# Patient Record
Sex: Male | Born: 1995 | Race: Black or African American | Hispanic: No | Marital: Single | Smoking: Current every day smoker
Health system: Southern US, Community
[De-identification: ages and names within clinical notes are randomized; demographics above are authoritative.]

## PROBLEM LIST (undated history)

## (undated) DIAGNOSIS — R079 Chest pain, unspecified: Secondary | ICD-10-CM

## (undated) DIAGNOSIS — J45909 Unspecified asthma, uncomplicated: Secondary | ICD-10-CM

---

## 2020-05-03 ENCOUNTER — Ambulatory Visit (HOSPITAL_COMMUNITY)
Admission: EM | Admit: 2020-05-03 | Discharge: 2020-05-03 | Disposition: A | Discharge status: Home / Self Care | Payer: No Typology Code available for payment source | Attending: Family Medicine | Admitting: Family Medicine

## 2020-05-03 ENCOUNTER — Encounter (HOSPITAL_COMMUNITY): Payer: Self-pay | Admitting: *Deleted

## 2020-05-03 ENCOUNTER — Other Ambulatory Visit: Payer: Self-pay

## 2020-05-03 DIAGNOSIS — F419 Anxiety disorder, unspecified: Secondary | ICD-10-CM

## 2020-05-03 DIAGNOSIS — R079 Chest pain, unspecified: Secondary | ICD-10-CM

## 2020-05-03 HISTORY — DX: Unspecified asthma, uncomplicated: J45.909

## 2020-05-03 MED ORDER — HYDROXYZINE HCL 25 MG PO TABS: 25.0000 mg | ORAL_TABLET | Freq: Three times a day (TID) | ORAL | 0 refills | Status: DC | PRN

## 2020-05-03 MED ORDER — NAPROXEN 500 MG PO TABS: 500.0000 mg | ORAL_TABLET | Freq: Two times a day (BID) | ORAL | 0 refills | Status: DC | PRN

## 2020-05-03 NOTE — ED Provider Notes (Signed)
[facility]    CSN: [id number] Arrival date & time: 05/03/20  0807      History   Chief Complaint Chief Complaint  Patient presents with  . Chest Pain    HPI Howard Rodriguez is a 25 y.o. male.   Patient here today with sharp chest pain that started on the right side and now is also on the left side since waking up this morning.  He is not sure exactly when it started or having any ideas on what could have brought it on.  Nothing seems to make it better or worse, though he thinks eating may make it worse potentially.  He denies shortness of breath, dizziness, palpitations, abdominal pain, nausea vomiting diarrhea, fevers, history of similar incidences.  He does have a history of anxiety but states this does not feel similar.  He notes he has an interview later today and does feel very stressed.  No significant cardiac history in his immediate family that he is aware of and no chronic cardiopulmonary issues known personally.     Past Medical History:  Diagnosis Date  . Asthma     There are no problems to display for this patient.   History reviewed. No pertinent surgical history.     Home Medications    Prior to Admission medications   Medication Sig Start Date End Date Taking? Authorizing Provider  hydrOXYzine (ATARAX/VISTARIL) 25 MG tablet Take 1 tablet (25 mg total) by mouth every 8 (eight) hours as needed. 05/03/20  Yes Particia Nearing, PA-C  naproxen (NAPROSYN) 500 MG tablet Take 1 tablet (500 mg total) by mouth 2 (two) times daily as needed. 05/03/20  Yes Particia Nearing, PA-C    Family History History reviewed. No pertinent family history.  Social History Social History   Tobacco Use  . Smoking status: Current Every Day Smoker  . Smokeless tobacco: Never Used  Substance Use Topics  . Drug use: Yes    Types: Marijuana    Comment: pt uses once or twice a day     Allergies   Other   Review of Systems Review of Systems Per  HPI Physical Exam Triage Vital Signs ED Triage Vitals  Enc Vitals Group     BP 05/03/20 0844 114/64     Pulse Rate 05/03/20 0844 69     Resp 05/03/20 0844 18     Temp 05/03/20 0844 98 F (36.7 C)     Temp Source 05/03/20 0844 Oral     SpO2 05/03/20 0844 100 %     Weight --      Height --      Head Circumference --      Peak Flow --      Pain Score 05/03/20 0845 6     Pain Loc --      Pain Edu? --      Excl. in GC? --    No data found.  Updated Vital Signs BP 114/64 (BP Location: Left Arm)   Pulse 69   Temp 98 F (36.7 C) (Oral)   Resp 18   SpO2 100%   Visual Acuity Right Eye Distance:   Left Eye Distance:   Bilateral Distance:    Right Eye Near:   Left Eye Near:    Bilateral Near:     Physical Exam Vitals and nursing note reviewed.  Constitutional:      Appearance: Normal appearance.  HENT:     Head: Atraumatic.  Mouth/Throat:     Mouth: Mucous membranes are moist.     Pharynx: Oropharynx is clear.  Eyes:     Extraocular Movements: Extraocular movements intact.     Conjunctiva/sclera: Conjunctivae normal.  Cardiovascular:     Rate and Rhythm: Normal rate and regular rhythm.     Pulses: Normal pulses.     Heart sounds: Normal heart sounds.  Pulmonary:     Effort: Pulmonary effort is normal. No respiratory distress.     Breath sounds: Normal breath sounds. No wheezing or rales.  Abdominal:     General: Bowel sounds are normal. There is no distension.     Palpations: Abdomen is soft.     Tenderness: There is no abdominal tenderness. There is no guarding.  Musculoskeletal:        General: No tenderness. Normal range of motion.     Cervical back: Normal range of motion and neck supple.  Skin:    General: Skin is warm and dry.  Neurological:     General: No focal deficit present.     Mental Status: He is oriented to person, place, and time.     Sensory: No sensory deficit.  Psychiatric:        Mood and Affect: Mood normal.        Thought Content:  Thought content normal.        Judgment: Judgment normal.      UC Treatments / Results  Labs (all labs ordered are listed, but only abnormal results are displayed) Labs Reviewed - No data to display  EKG   Radiology No results found.  Procedures Procedures (including critical care time)  Medications Ordered in UC Medications - No data to display  Initial Impression / Assessment and Plan / UC Course  I have reviewed the triage vital signs and the nursing notes.  Pertinent labs & imaging results that were available during my care of the patient were reviewed by me and considered in my medical decision making (see chart for details).     Exam and vital signs very reassuring today and benign.  EKG normal sinus rhythm showing an incomplete bundle branch block, no old EKGs to compare this to.  Low suspicion for cardiac cause of his chest pain today given age, risk factors, quality of pain symptoms.  Do suspect these are more anxiety related and will trial hydroxyzine and naproxen as needed and give work note for today to see if this helps.  Discussed if not resolving or worsening to follow-up with cardiology and establish PCP in the area as his current PCP is in [place].  ED precautions given for acutely worsening symptoms.  Final Clinical Impressions(s) / UC Diagnoses   Final diagnoses:  Nonspecific chest pain  Anxiety   Discharge Instructions   None    ED Prescriptions    Medication Sig Dispense Auth. Provider   hydrOXYzine (ATARAX/VISTARIL) 25 MG tablet Take 1 tablet (25 mg total) by mouth every 8 (eight) hours as needed. 30 tablet Particia Nearing, [place]   naproxen (NAPROSYN) 500 MG tablet Take 1 tablet (500 mg total) by mouth 2 (two) times daily as needed. 30 tablet Particia Nearing, [place]     PDMP not reviewed this encounter.   Particia Nearing, [place] 05/03/20 1428

## 2020-05-03 NOTE — ED Triage Notes (Signed)
Pt reports CP started this morning . Pt un clear how long CP has been going today . Pt reports multiple Sx's with CP. No direct family Hx of heart disease . Pt reports CP worse when trying to sleep.

## 2020-08-13 ENCOUNTER — Other Ambulatory Visit: Payer: Self-pay

## 2020-08-13 ENCOUNTER — Encounter (HOSPITAL_COMMUNITY): Payer: Self-pay

## 2020-08-13 ENCOUNTER — Emergency Department (HOSPITAL_COMMUNITY)
Admission: EM | Admit: 2020-08-13 | Discharge: 2020-08-13 | Disposition: A | Discharge status: Home / Self Care | Payer: No Typology Code available for payment source | Attending: Emergency Medicine | Admitting: Emergency Medicine

## 2020-08-13 ENCOUNTER — Emergency Department (HOSPITAL_COMMUNITY): Payer: No Typology Code available for payment source

## 2020-08-13 DIAGNOSIS — R079 Chest pain, unspecified: Secondary | ICD-10-CM | POA: Insufficient documentation

## 2020-08-13 DIAGNOSIS — F1290 Cannabis use, unspecified, uncomplicated: Secondary | ICD-10-CM | POA: Insufficient documentation

## 2020-08-13 DIAGNOSIS — J45909 Unspecified asthma, uncomplicated: Secondary | ICD-10-CM | POA: Insufficient documentation

## 2020-08-13 DIAGNOSIS — Z87891 Personal history of nicotine dependence: Secondary | ICD-10-CM | POA: Diagnosis not present

## 2020-08-13 LAB — BASIC METABOLIC PANEL
Anion gap: 6 (ref 5–15)
BUN: 18 mg/dL (ref 6–20)
CO2: 25 mmol/L (ref 22–32)
Calcium: 9.4 mg/dL (ref 8.9–10.3)
Chloride: 106 mmol/L (ref 98–111)
Creatinine, Ser: 0.9 mg/dL (ref 0.61–1.24)
GFR, Estimated: >60 mL/min (ref >60)
Glucose, Bld: 92 mg/dL (ref 70–99)
Potassium: 4 mmol/L (ref 3.5–5.1)
Sodium: 137 mmol/L (ref 135–145)

## 2020-08-13 LAB — CBC
HCT: 45.7 % (ref 39.0–52.0)
Hemoglobin: 14.9 g/dL (ref 13.0–17.0)
MCH: 28.8 pg (ref 26.0–34.0)
MCHC: 32.6 g/dL (ref 30.0–36.0)
MCV: 88.2 fL (ref 80.0–100.0)
Platelets: 250 10*3/uL (ref 150–400)
RBC: 5.18 MIL/uL (ref 4.22–5.81)
RDW: 12.1 % (ref 11.5–15.5)
WBC: 6.2 10*3/uL (ref 4.0–10.5)
nRBC: 0 % (ref 0.0–0.2)

## 2020-08-13 LAB — TROPONIN I (HIGH SENSITIVITY)
Troponin I (High Sensitivity): 3 ng/L (ref <18)
Troponin I (High Sensitivity): 3 ng/L (ref <18)

## 2020-08-13 MED ORDER — ONDANSETRON 4 MG PO TBDP
4.0000 mg | ORAL_TABLET | Freq: Once | ORAL | Status: AC
  Administered 2020-08-13: 17:00:00 4 mg via ORAL
  Filled 2020-08-13: qty 1

## 2020-08-13 MED ORDER — IBUPROFEN 600 MG PO TABS: 600.0000 mg | ORAL_TABLET | Freq: Four times a day (QID) | ORAL | 0 refills | Status: DC | PRN

## 2020-08-13 NOTE — ED Provider Notes (Signed)
[facility] EMERGENCY DEPARTMENT Provider Note   CSN: [id number] Arrival date & time: 08/13/20  1631     History CC:  Chest pain   Howard Rodriguez is a 25 y.o. male presented emerged department left-sided chest pain.  He reports gradual onset while he was at work earlier today around 1:00.  He says he works at a [employer] and does a lot of lifting.  He began to feel a sharp, squeezing, pressure sensation in his epigastrium and left side of his breast.  It does not radiate anywhere.  It is similar feeling several months ago which lasted about a day and resolved on its own.  He says his current sensation is worse with certain movements, deep inspiration.  He has not taken any medication.  Nothing seems to make it better, although when he sits still does not bother him as much.  It is currently 5 out of 10 intensity.  He does report that he smokes marijuana but denies nicotine use.  He denies history of hypertension, diabetes, hyperlipidemia, or significant family history of MI.  He denies any other medical problems aside from occasional asthma.  HPI     Past Medical History:  Diagnosis Date   Asthma     There are no problems to display for this patient.   No past surgical history on file.     No family history on file.  Social History   Tobacco Use   Smoking status: Every Day    Pack years: 0.00   Smokeless tobacco: Never  Substance Use Topics   Drug use: Yes    Types: Marijuana    Comment: pt uses once or twice a day    Home Medications Prior to Admission medications   Medication Sig Start Date End Date Taking? Authorizing Provider  ibuprofen (ADVIL) 600 MG tablet Take 1 tablet (600 mg total) by mouth every 6 (six) hours as needed for up to 30 doses. 08/13/20  Yes Terald Sleeper, MD  hydrOXYzine (ATARAX/VISTARIL) 25 MG tablet Take 1 tablet (25 mg total) by mouth every 8 (eight) hours as needed. 05/03/20   Particia Nearing,  PA-C  naproxen (NAPROSYN) 500 MG tablet Take 1 tablet (500 mg total) by mouth 2 (two) times daily as needed. 05/03/20   Particia Nearing, PA-C    Allergies    Other  Review of Systems   Review of Systems  Constitutional:  Negative for chills and fever.  Eyes:  Negative for photophobia and visual disturbance.  Respiratory:  Negative for cough and shortness of breath.   Cardiovascular:  Positive for chest pain. Negative for palpitations.  Gastrointestinal:  Negative for abdominal pain and vomiting.  Musculoskeletal:  Positive for arthralgias and myalgias.  Skin:  Negative for color change and rash.  Neurological:  Negative for syncope and light-headedness.  All other systems reviewed and are negative.  Physical Exam Updated Vital Signs BP 136/74   Pulse (!) 50   Temp 97.6 F (36.4 C) (Oral)   Resp 10   SpO2 100%   Physical Exam Constitutional:      General: He is not in acute distress. HENT:     Head: Normocephalic and atraumatic.  Eyes:     Conjunctiva/sclera: Conjunctivae normal.     Pupils: Pupils are equal, round, and reactive to light.  Cardiovascular:     Rate and Rhythm: Normal rate and regular rhythm.     Pulses: Normal pulses.  Pulmonary:  Effort: Pulmonary effort is normal. No respiratory distress.  Abdominal:     General: There is no distension.     Tenderness: There is no abdominal tenderness.  Musculoskeletal:     Comments: Left intercostal chest ttp, worse with crowing rooster   Skin:    General: Skin is warm and dry.  Neurological:     General: No focal deficit present.     Mental Status: He is alert. Mental status is at baseline.  Psychiatric:        Mood and Affect: Mood normal.        Behavior: Behavior normal.    ED Results / Procedures / Treatments   Labs (all labs ordered are listed, but only abnormal results are displayed) Labs Reviewed  BASIC METABOLIC PANEL  CBC  TROPONIN I (HIGH SENSITIVITY)  TROPONIN I (HIGH SENSITIVITY)     EKG EKG Interpretation  Date/Time:  Saturday August 13 2020 16:50:15 EDT Ventricular Rate:  50 PR Interval:  160 QRS Duration: 94 QT Interval:  400 QTC Calculation: 364 R Axis:   86 Text Interpretation: Sinus bradycardia Incomplete right bundle branch block No significant change from May 03 2020 ecg no STEMI Confirmed by Alvester Chou [phone]) on 08/13/2020 7:13:41 PM  Radiology DG Chest 2 View  Result Date: 08/13/2020 CLINICAL DATA:  Chest pain and short of breath EXAM: CHEST - 2 VIEW COMPARISON:  None. FINDINGS: The heart size and mediastinal contours are within normal limits. Both lungs are clear. The visualized skeletal structures are unremarkable. IMPRESSION: No active cardiopulmonary disease. Electronically Signed   By: Marlan Palau M.D.   On: 08/13/2020 17:30    Procedures Procedures   Medications Ordered in ED Medications  ondansetron (ZOFRAN-ODT) disintegrating tablet 4 mg (4 mg Oral Given 08/13/20 1706)    ED Course  I have reviewed the triage vital signs and the nursing notes.  Pertinent labs & imaging results that were available during my care of the patient were reviewed by me and considered in my medical decision making (see chart for details).  This is a fairly healthy 25 year old male presenting to ED with left-sided chest pain.  I suspect this most likely related to costochondritis.  The pain is reproducible crowing rooster and with palpation of the intercostal muscle.  He has no risk factors for cardiac disease.  I have a lower suspicion for ACS, PE, pneumothorax, pneumonia, aortic dissection, or other life-threatening emergency.  I personally reviewed his labs, EKG, chest x-ray, which showed no acute emergencies.  I advised NSAIDs and conservative management at home.     Final Clinical Impression(s) / ED Diagnoses Final diagnoses:  Chest pain, unspecified type    Rx / DC Orders ED Discharge Orders          Ordered    ibuprofen (ADVIL) 600 MG tablet   Every 6 hours PRN        08/13/20 2008             Terald Sleeper, MD 08/14/20 1006

## 2020-08-13 NOTE — ED Triage Notes (Signed)
Patient complains of chest pain that is worse with cough and inspiration. Patient has been seen at urgent care in past for same. States that he does a lot of lifting for work. NAD

## 2020-08-13 NOTE — ED Provider Notes (Signed)
[facility] Provider Triage Evaluation Note  [place] , a 25 y.o. male  was evaluated in triage.  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  He states that the pain radiates into his left arm, and he has some associated chest tightness and shortness of breath.  Denies any palpitations or history of cardiac issues in the past.  Denies any recent travel or prolonged immobilization, denies any recent surgical procedures or family history of cardiac disease at young age..  Review of Systems  Positive: Chest pain, shortness of breath, chest tightness, nausea. Negative: Syncope, cough, wheezing, palpitations, diaphoresis, fever.  Physical Exam  BP 131/78 (BP Location: Left Arm)   Pulse (!) 50   Temp 97.8 F (36.6 C) (Oral)   Resp 16   SpO2 100%  Gen:   Awake, no distress   Resp:  Normal effort  MSK:   Moves extremities without difficulty  Other:  Regular rate and rhythm, no murmurs/gallops/rubs.  Lungs are CTA B  Medical Decision Making  Medically screening exam initiated at 5:00 PM.  Appropriate orders placed.  [place] was informed that the remainder of the evaluation will be completed by another provider, this initial triage assessment does not replace that evaluation, and the importance of remaining in the ED until their evaluation is complete.  This chart was dictated using voice recognition software, Dragon. Despite the best efforts of this provider to proofread and correct errors, errors may still occur which can change documentation meaning.    Sherrilee Gilles 08/13/20 1701    Koleen Distance, MD 08/14/20 1300

## 2021-02-19 ENCOUNTER — Other Ambulatory Visit: Payer: Self-pay

## 2021-02-19 ENCOUNTER — Emergency Department (HOSPITAL_COMMUNITY)
Admission: EM | Admit: 2021-02-19 | Discharge: 2021-02-20 | Disposition: A | Discharge status: Home / Self Care | Payer: Self-pay | Attending: Emergency Medicine | Admitting: Emergency Medicine

## 2021-02-19 DIAGNOSIS — F17200 Nicotine dependence, unspecified, uncomplicated: Secondary | ICD-10-CM | POA: Insufficient documentation

## 2021-02-19 DIAGNOSIS — S02401A Maxillary fracture, unspecified, initial encounter for closed fracture: Secondary | ICD-10-CM

## 2021-02-19 DIAGNOSIS — S0511XA Contusion of eyeball and orbital tissues, right eye, initial encounter: Secondary | ICD-10-CM

## 2021-02-19 DIAGNOSIS — S0240CA Maxillary fracture, right side, initial encounter for closed fracture: Secondary | ICD-10-CM | POA: Insufficient documentation

## 2021-02-19 DIAGNOSIS — W01198A Fall on same level from slipping, tripping and stumbling with subsequent striking against other object, initial encounter: Secondary | ICD-10-CM | POA: Diagnosis not present

## 2021-02-19 DIAGNOSIS — S01111A Laceration without foreign body of right eyelid and periocular area, initial encounter: Secondary | ICD-10-CM | POA: Insufficient documentation

## 2021-02-19 DIAGNOSIS — Z23 Encounter for immunization: Secondary | ICD-10-CM | POA: Insufficient documentation

## 2021-02-19 DIAGNOSIS — J45909 Unspecified asthma, uncomplicated: Secondary | ICD-10-CM | POA: Diagnosis not present

## 2021-02-19 DIAGNOSIS — S0990XA Unspecified injury of head, initial encounter: Secondary | ICD-10-CM | POA: Diagnosis present

## 2021-02-19 DIAGNOSIS — S0181XA Laceration without foreign body of other part of head, initial encounter: Secondary | ICD-10-CM
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
## 2021-02-20 ENCOUNTER — Emergency Department (HOSPITAL_COMMUNITY): Payer: Self-pay

## 2021-02-20 MED ORDER — CEPHALEXIN 500 MG PO CAPS: 500.0000 mg | ORAL_CAPSULE | Freq: Four times a day (QID) | ORAL | 0 refills | Status: DC

## 2021-02-20 MED ORDER — LIDOCAINE-EPINEPHRINE-TETRACAINE (LET) TOPICAL GEL
3.0000 mL | Freq: Once | TOPICAL | Status: AC
  Administered 2021-02-20: 03:00:00 3 mL via TOPICAL
  Filled 2021-02-20: qty 3

## 2021-02-20 MED ORDER — TETANUS-DIPHTH-ACELL PERTUSSIS 5-2.5-18.5 LF-MCG/0.5 IM SUSY
0.5000 mL | PREFILLED_SYRINGE | Freq: Once | INTRAMUSCULAR | Status: AC
  Administered 2021-02-20: 03:00:00 0.5 mL via INTRAMUSCULAR
  Filled 2021-02-20: qty 0.5

## 2021-02-20 MED ORDER — KETOROLAC TROMETHAMINE 15 MG/ML IJ SOLN
15.0000 mg | Freq: Once | INTRAMUSCULAR | Status: AC
  Administered 2021-02-20: 06:00:00 15 mg via INTRAMUSCULAR
  Filled 2021-02-20: qty 1

## 2021-02-20 MED ORDER — NAPROXEN 500 MG PO TABS: 500.0000 mg | ORAL_TABLET | Freq: Two times a day (BID) | ORAL | 0 refills | Status: DC

## 2021-02-20 MED ORDER — FLUORESCEIN SODIUM 1 MG OP STRP
ORAL_STRIP | OPHTHALMIC | Status: AC
  Filled 2021-02-20: qty 1

## 2021-02-20 MED ORDER — LIDOCAINE-EPINEPHRINE (PF) 2 %-1:200000 IJ SOLN
10.0000 mL | Freq: Once | INTRAMUSCULAR | Status: AC
  Administered 2021-02-20: 10 mL
  Filled 2021-02-20: qty 20

## 2021-02-20 MED ORDER — HYDROCODONE-ACETAMINOPHEN 5-325 MG PO TABS
2.0000 | ORAL_TABLET | Freq: Once | ORAL | Status: AC
  Administered 2021-02-20: 03:00:00 2 via ORAL
  Filled 2021-02-20: qty 2
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
## 2021-02-20 NOTE — ED Provider Notes (Signed)
..  Laceration Repair  Date/Time: 02/20/2021 5:00 AM Performed by: Glynn Octave, MD Authorized by: Glynn Octave, MD   Consent:    Consent obtained:  Verbal   Consent given by:  Patient   Risks, benefits, and alternatives were discussed: yes     Risks discussed:  Infection, need for additional repair, poor cosmetic result, pain and poor wound healing   Alternatives discussed:  No treatment Universal protocol:    Procedure explained and questions answered to patient or proxy's satisfaction: yes     Imaging studies available: yes     Patient identity confirmed:  Verbally with patient Anesthesia:    Anesthesia method:  Local infiltration   Local anesthetic:  Lidocaine 2% WITH epi Laceration details:    Location:  Face   Face location:  R upper eyelid   Extent:  Partial thickness   Length (cm):  4 Pre-procedure details:    Preparation:  Patient was prepped and draped in usual sterile fashion and imaging obtained to evaluate for foreign bodies Exploration:    Limited defect created (wound extended): no     Hemostasis achieved with:  Direct pressure and epinephrine   Imaging outcome: foreign body not noted     Wound exploration: wound explored through full range of motion and entire depth of wound visualized     Wound extent: areolar tissue violated and underlying fracture     Contaminated: no   Treatment:    Area cleansed with:  Povidone-iodine   Amount of cleaning:  Standard   Irrigation solution:  Sterile saline   Irrigation method:  Pressure wash   Debridement:  Minimal Skin repair:    Repair method:  Sutures   Suture size:  6-0   Suture material:  Nylon   Suture technique:  Simple interrupted   Number of sutures:  6 Approximation:    Approximation:  Close Repair type:    Repair type:  Intermediate Post-procedure details:    Dressing:  Antibiotic ointment and adhesive bandage   Procedure completion:  Suzi Roots, MD 02/20/21 4503

## 2021-02-20 NOTE — ED Notes (Signed)
Patient transported to CT 

## 2021-02-20 NOTE — ED Notes (Signed)
Pt  able to ambulate with strong and steady gate and passes PO challenge. Provider notified of same.

## 2021-02-20 NOTE — Discharge Instructions (Addendum)
Take Keflex as prescribed. You may use Naproxen for management of pain. Avoid soaking your wound in stagnant or dirty water such as while taking a bath. You can shower normally. Keep the area clean with mild soap and warm water. Do not apply peroxide or alcohol to your wound as this can break down newly forming skin and prolong wound healing. If you keep the area bandaged, change the dressing/bandage at least once per day. Have your sutures removed in 7 days.

## 2021-03-11 ENCOUNTER — Other Ambulatory Visit: Payer: Self-pay

## 2021-03-11 ENCOUNTER — Emergency Department (HOSPITAL_COMMUNITY)
Admission: EM | Admit: 2021-03-11 | Discharge: 2021-03-12 | Disposition: A | Discharge status: Home / Self Care | Payer: 59 | Attending: Emergency Medicine | Admitting: Emergency Medicine

## 2021-03-11 ENCOUNTER — Encounter (HOSPITAL_COMMUNITY): Payer: Self-pay | Admitting: *Deleted

## 2021-03-11 ENCOUNTER — Emergency Department (HOSPITAL_COMMUNITY): Payer: 59

## 2021-03-11 DIAGNOSIS — R0789 Other chest pain: Secondary | ICD-10-CM | POA: Diagnosis not present

## 2021-03-11 DIAGNOSIS — Z5321 Procedure and treatment not carried out due to patient leaving prior to being seen by health care provider: Secondary | ICD-10-CM | POA: Diagnosis not present

## 2021-03-11 DIAGNOSIS — F419 Anxiety disorder, unspecified: Secondary | ICD-10-CM | POA: Diagnosis not present

## 2021-03-11 DIAGNOSIS — R0602 Shortness of breath: Secondary | ICD-10-CM | POA: Diagnosis not present

## 2021-03-11 LAB — SALICYLATE LEVEL: Salicylate Lvl: <7 mg/dL — ABNORMAL LOW (ref 7.0–30.0)

## 2021-03-11 LAB — CBC
HCT: 44.3 % (ref 39.0–52.0)
Hemoglobin: 14.4 g/dL (ref 13.0–17.0)
MCH: 28.5 pg (ref 26.0–34.0)
MCHC: 32.5 g/dL (ref 30.0–36.0)
MCV: 87.5 fL (ref 80.0–100.0)
Platelets: 280 10*3/uL (ref 150–400)
RBC: 5.06 MIL/uL (ref 4.22–5.81)
RDW: 12.1 % (ref 11.5–15.5)
WBC: 9.7 10*3/uL (ref 4.0–10.5)
nRBC: 0 % (ref 0.0–0.2)

## 2021-03-11 LAB — RAPID URINE DRUG SCREEN, HOSP PERFORMED
Amphetamines: NOT DETECTED
Barbiturates: NOT DETECTED
Benzodiazepines: NOT DETECTED
Cocaine: NOT DETECTED
Opiates: NOT DETECTED
Tetrahydrocannabinol: POSITIVE — AB

## 2021-03-11 LAB — BASIC METABOLIC PANEL
Anion gap: 11 (ref 5–15)
BUN: 20 mg/dL (ref 6–20)
CO2: 23 mmol/L (ref 22–32)
Calcium: 9.3 mg/dL (ref 8.9–10.3)
Chloride: 101 mmol/L (ref 98–111)
Creatinine, Ser: 0.97 mg/dL (ref 0.61–1.24)
GFR, Estimated: >60 mL/min (ref >60)
Glucose, Bld: 103 mg/dL — ABNORMAL HIGH (ref 70–99)
Potassium: 3.8 mmol/L (ref 3.5–5.1)
Sodium: 135 mmol/L (ref 135–145)

## 2021-03-11 LAB — ACETAMINOPHEN LEVEL: Acetaminophen (Tylenol), Serum: <10 ug/mL — ABNORMAL LOW (ref 10–30)

## 2021-03-11 LAB — ETHANOL: Alcohol, Ethyl (B): <10 mg/dL (ref <10)

## 2021-03-11 LAB — TROPONIN I (HIGH SENSITIVITY): Troponin I (High Sensitivity): 3 ng/L (ref <18)

## 2021-03-11 NOTE — ED Triage Notes (Signed)
The pt ate some mushrooms approx 2 hours ago now c/o chest pain  he has ingested mushrooms previously

## 2021-03-11 NOTE — ED Provider Triage Note (Signed)
Emergency Medicine Provider Triage Evaluation Note  [place] , a 26 y.o. male  was evaluated in triage.  Pt complains of chest tightness, SOB, like heart is going to stop.  States that symptoms started after using mushrooms tonight.  Has never experienced this before.  Denies any other coingestants.   Review of Systems  Positive: Chest tightness, anxiety, sob Negative: Fever, cough  Physical Exam  BP 133/74    Pulse 78    Temp 98.2 F (36.8 C) (Oral)    Resp 16    Ht 6\' 1"  (1.854 m)    Wt 65.8 kg    SpO2 98%    BMI 19.13 kg/m  Gen:   Awake, no distress   Resp:  Normal effort  MSK:   Moves extremities without difficulty  Other:    Medical Decision Making  Medically screening exam initiated at 10:18 PM.  Appropriate orders placed.  Jantz was informed that the remainder of the evaluation will be completed by another provider, this initial triage assessment does not replace that evaluation, and the importance of remaining in the ED until their evaluation is complete.  Chest tightness, anxiety, drug use   [place], Roxy Horseman 03/11/21 2219

## 2021-03-11 NOTE — ED Notes (Signed)
Left without being seen.

## 2021-03-11 NOTE — ED Notes (Signed)
Girlfriend flagged RN down due to concern for pt. Pt now states he "ate some shrooms". RN provided verbal reassurance.

## 2021-03-11 NOTE — ED Notes (Signed)
Pt reports that he imbibed in smoking marijuana and that is when he starting feeling "like he is going to die".

## 2021-03-11 NOTE — ED Notes (Signed)
Pt eating [employer] chips with large grin on face and walking towards door with GF. Rn asked if he was leaving. He states "I think I'm fine; I'm leaving". Ambulated with steady gait.

## 2021-10-28 ENCOUNTER — Emergency Department (HOSPITAL_COMMUNITY)
Admission: EM | Admit: 2021-10-28 | Discharge: 2021-10-28 | Disposition: A | Discharge status: Home / Self Care | Payer: 59 | Attending: Emergency Medicine | Admitting: Emergency Medicine

## 2021-10-28 ENCOUNTER — Other Ambulatory Visit: Payer: Self-pay

## 2021-10-28 ENCOUNTER — Encounter (HOSPITAL_COMMUNITY): Payer: Self-pay | Admitting: Emergency Medicine

## 2021-10-28 DIAGNOSIS — R110 Nausea: Secondary | ICD-10-CM | POA: Diagnosis not present

## 2021-10-28 DIAGNOSIS — T7840XA Allergy, unspecified, initial encounter: Secondary | ICD-10-CM | POA: Diagnosis present

## 2021-10-28 DIAGNOSIS — T7802XA Anaphylactic reaction due to shellfish (crustaceans), initial encounter: Secondary | ICD-10-CM | POA: Diagnosis not present

## 2021-10-28 DIAGNOSIS — R0602 Shortness of breath: Secondary | ICD-10-CM | POA: Insufficient documentation

## 2021-10-28 MED ORDER — PREDNISONE 20 MG PO TABS
60.0000 mg | ORAL_TABLET | Freq: Once | ORAL | Status: AC
  Administered 2021-10-28: 60 mg via ORAL
  Filled 2021-10-28: qty 3

## 2021-10-28 MED ORDER — DIPHENHYDRAMINE HCL 25 MG PO CAPS
25.0000 mg | ORAL_CAPSULE | Freq: Once | ORAL | Status: AC
Start: 2021-10-28 — End: 2021-10-28
  Administered 2021-10-28: 25 mg via ORAL
  Filled 2021-10-28: qty 1

## 2021-10-28 MED ORDER — ONDANSETRON 4 MG PO TBDP
4.0000 mg | ORAL_TABLET | Freq: Once | ORAL | Status: AC
  Administered 2021-10-28: 4 mg via ORAL
  Filled 2021-10-28: qty 1

## 2021-10-28 MED ORDER — EPINEPHRINE 0.3 MG/0.3ML IJ SOAJ: 0.3000 mg | INTRAMUSCULAR | 1 refills | Status: DC | PRN

## 2021-10-28 MED ORDER — PREDNISONE 20 MG PO TABS: ORAL_TABLET | ORAL | 0 refills | Status: DC

## 2021-10-28 NOTE — Discharge Instructions (Signed)
Take the prescribed medication as directed.  Can continue benadryl as needed as well. Keep epi pen with you at all times.  Use for severe lip/tongue swelling, throat closing, shortness of breath, etc.  If used, you do need to come to the ED for monitoring. Follow-up with your primary care doctor. Return to the ED for new or worsening symptoms.

## 2021-10-28 NOTE — ED Triage Notes (Signed)
Patient reports feeling dizzy/lightheaded after eating calamari this morning with mild chest tightness , respirations unlabored , airway intact / no oral swelling .

## 2021-10-28 NOTE — ED Provider Notes (Signed)
[facility] EMERGENCY DEPARTMENT Provider Note   CSN: [id number] Arrival date & time: 10/28/21  0315     History  Chief Complaint  Patient presents with   Allergic Reaction    Howard Rodriguez is a 26 y.o. male.  The history is provided by the patient and medical records.  Allergic Reaction  26 year old male presenting today with concern of allergic reaction.  He has known shellfish allergy and had some calamari tonight from restaurant and shortly afterwards started to have some trouble breathing and nausea, felt as if he may pass out.  No LOC.  Denies vomiting.  No lip/tongue swelling.  No trouble swallowing.  No intervention tried PTA.  Does not have epi pen at home.  Home Medications Prior to Admission medications   Medication Sig Start Date End Date Taking? Authorizing Provider  EPINEPHrine 0.3 mg/0.3 mL IJ SOAJ injection Inject 0.3 mg into the muscle as needed for anaphylaxis. 10/28/21  Yes Garlon Hatchet, PA-C  predniSONE (DELTASONE) 20 MG tablet Take 40 mg by mouth daily for 3 days, then 20mg  by mouth daily for 3 days, then 10mg  daily for 3 days 10/28/21  Yes , PA-C  acetaminophen (NON-ASPIRIN) 500 MG tablet Take 2,000 mg by mouth every 6 (six) hours as needed for mild pain.    [provider]  cephALEXin (KEFLEX) 500 MG capsule Take 1 capsule (500 mg total) by mouth 4 (four) times daily. 02/20/21   Garlon Hatchet, PA-C  hydrOXYzine (ATARAX/VISTARIL) 25 MG tablet Take 1 tablet (25 mg total) by mouth every 8 (eight) hours as needed. Patient not taking: Reported on 02/20/2021 05/03/20   02/22/2021, PA-C  ibuprofen (ADVIL) 600 MG tablet Take 1 tablet (600 mg total) by mouth every 6 (six) hours as needed for up to 30 doses. Patient not taking: Reported on 02/20/2021 08/13/20   02/22/2021, MD  naproxen (NAPROSYN) 500 MG tablet Take 1 tablet (500 mg total) by mouth 2 (two) times daily. 02/20/21   Terald Sleeper, PA-C       Allergies    Other    Review of Systems   Review of Systems  Constitutional:        Allergic rxn  All other systems reviewed and are negative.   Physical Exam Updated Vital Signs BP 134/83   Pulse 64   Temp 98.1 F (36.7 C) (Oral)   Resp 16   SpO2 99%   Physical Exam Vitals and nursing note reviewed.  Constitutional:      Appearance: He is well-developed.  HENT:     Head: Normocephalic and atraumatic.     Mouth/Throat:     Comments: No lip/tongue swelling, handling secretions well, no stridor Eyes:     Conjunctiva/sclera: Conjunctivae normal.     Pupils: Pupils are equal, round, and reactive to light.  Cardiovascular:     Rate and Rhythm: Normal rate and regular rhythm.     Heart sounds: Normal heart sounds.  Pulmonary:     Effort: Pulmonary effort is normal. No respiratory distress.     Breath sounds: Normal breath sounds. No rhonchi.     Comments: Lungs CTAB Abdominal:     General: Bowel sounds are normal.     Palpations: Abdomen is soft.     Tenderness: There is no abdominal tenderness. There is no rebound.  Musculoskeletal:        General: Normal range of motion.     Cervical back: Normal range of  motion.  Skin:    General: Skin is warm and dry.  Neurological:     Mental Status: He is alert and oriented to person, place, and time.     ED Results / Procedures / Treatments   Labs (all labs ordered are listed, but only abnormal results are displayed) Labs Reviewed - No data to display  EKG None  Radiology No results found.  Procedures Procedures    Medications Ordered in ED Medications  predniSONE (DELTASONE) tablet 60 mg (60 mg Oral Given 10/28/21 0411)  diphenhydrAMINE (BENADRYL) capsule 25 mg (25 mg Oral Given 10/28/21 0412)  ondansetron (ZOFRAN-ODT) disintegrating tablet 4 mg (4 mg Oral Given 10/28/21 0411)    ED Course/ Medical Decision Making/ A&P                           Medical Decision Making Risk Prescription drug  management.   26 y.o. M here with allergic reaction after eating calamari.  He does have known shellfish allergy.  Afebrile, non-toxic.  Does not have any lip or tongue swelling, handling secretions well, no stridor.  Lungs are clear without any wheezes or rhonchi.  Possibly reaction due to cross-contamination.  He was treated here with prednisone and Benadryl, tolerated well.  Stable for discharge home with same.  Given prescription for EpiPen, instructions and indications for admission discussed.  Follow-up with PCP.  Return here for any concerns.  Final Clinical Impression(s) / ED Diagnoses Final diagnoses:  Allergic reaction, initial encounter    Rx / DC Orders ED Discharge Orders          Ordered    predniSONE (DELTASONE) 20 MG tablet        10/28/21 0438    EPINEPHrine 0.3 mg/0.3 mL IJ SOAJ injection  As needed        10/28/21 0438              Garlon Hatchet, PA-C 10/28/21 0454    Melene Plan, DO 10/28/21 [phone]

## 2021-11-30 ENCOUNTER — Emergency Department (HOSPITAL_COMMUNITY): Payer: PRIVATE HEALTH INSURANCE

## 2021-11-30 ENCOUNTER — Other Ambulatory Visit: Payer: Self-pay

## 2021-11-30 ENCOUNTER — Encounter (HOSPITAL_COMMUNITY): Payer: Self-pay | Admitting: Emergency Medicine

## 2021-11-30 ENCOUNTER — Emergency Department (HOSPITAL_COMMUNITY)
Admission: EM | Admit: 2021-11-30 | Discharge: 2021-11-30 | Disposition: A | Discharge status: Home / Self Care | Payer: PRIVATE HEALTH INSURANCE | Attending: Student | Admitting: Student

## 2021-11-30 DIAGNOSIS — R0602 Shortness of breath: Secondary | ICD-10-CM | POA: Diagnosis not present

## 2021-11-30 DIAGNOSIS — R079 Chest pain, unspecified: Secondary | ICD-10-CM | POA: Diagnosis not present

## 2021-11-30 DIAGNOSIS — Z5321 Procedure and treatment not carried out due to patient leaving prior to being seen by health care provider: Secondary | ICD-10-CM | POA: Insufficient documentation

## 2021-11-30 LAB — COMPREHENSIVE METABOLIC PANEL
ALT: 35 U/L (ref 0–44)
AST: 23 U/L (ref 15–41)
Albumin: 3.9 g/dL (ref 3.5–5.0)
Alkaline Phosphatase: 58 U/L (ref 38–126)
Anion gap: 8 (ref 5–15)
BUN: 16 mg/dL (ref 6–20)
CO2: 23 mmol/L (ref 22–32)
Calcium: 9.1 mg/dL (ref 8.9–10.3)
Chloride: 106 mmol/L (ref 98–111)
Creatinine, Ser: 1.12 mg/dL (ref 0.61–1.24)
GFR, Estimated: >60 mL/min (ref >60)
Glucose, Bld: 106 mg/dL — ABNORMAL HIGH (ref 70–99)
Potassium: 3.7 mmol/L (ref 3.5–5.1)
Sodium: 137 mmol/L (ref 135–145)
Total Bilirubin: 0.5 mg/dL (ref 0.3–1.2)
Total Protein: 6.3 g/dL — ABNORMAL LOW (ref 6.5–8.1)

## 2021-11-30 LAB — CBC WITH DIFFERENTIAL/PLATELET
Abs Immature Granulocytes: 0.02 10*3/uL (ref 0.00–0.07)
Basophils Absolute: 0 10*3/uL (ref 0.0–0.1)
Basophils Relative: 1 %
Eosinophils Absolute: 0.2 10*3/uL (ref 0.0–0.5)
Eosinophils Relative: 2 %
HCT: 44.1 % (ref 39.0–52.0)
Hemoglobin: 14.7 g/dL (ref 13.0–17.0)
Immature Granulocytes: 0 %
Lymphocytes Relative: 32 %
Lymphs Abs: 2.5 10*3/uL (ref 0.7–4.0)
MCH: 28.8 pg (ref 26.0–34.0)
MCHC: 33.3 g/dL (ref 30.0–36.0)
MCV: 86.3 fL (ref 80.0–100.0)
Monocytes Absolute: 0.6 10*3/uL (ref 0.1–1.0)
Monocytes Relative: 7 %
Neutro Abs: 4.5 10*3/uL (ref 1.7–7.7)
Neutrophils Relative %: 58 %
Platelets: 273 10*3/uL (ref 150–400)
RBC: 5.11 MIL/uL (ref 4.22–5.81)
RDW: 12.1 % (ref 11.5–15.5)
WBC: 7.9 10*3/uL (ref 4.0–10.5)
nRBC: 0 % (ref 0.0–0.2)

## 2021-11-30 LAB — TROPONIN I (HIGH SENSITIVITY)
Troponin I (High Sensitivity): 3 ng/L (ref <18)
Troponin I (High Sensitivity): 2 ng/L (ref <18)

## 2021-11-30 NOTE — ED Triage Notes (Signed)
Patient arrived with EMS from home reports central chest pain with mild SOB this morning , no emesis or diaphoresis .

## 2021-11-30 NOTE — ED Notes (Signed)
No answer in lobby x 2.

## 2021-12-01 ENCOUNTER — Other Ambulatory Visit: Payer: Self-pay

## 2021-12-01 ENCOUNTER — Emergency Department (HOSPITAL_COMMUNITY): Payer: PRIVATE HEALTH INSURANCE

## 2021-12-01 ENCOUNTER — Emergency Department (HOSPITAL_COMMUNITY)
Admission: EM | Admit: 2021-12-01 | Discharge: 2021-12-01 | Disposition: A | Discharge status: Home / Self Care | Payer: PRIVATE HEALTH INSURANCE | Attending: Emergency Medicine | Admitting: Emergency Medicine

## 2021-12-01 ENCOUNTER — Encounter (HOSPITAL_COMMUNITY): Payer: Self-pay | Admitting: Emergency Medicine

## 2021-12-01 DIAGNOSIS — R0789 Other chest pain: Secondary | ICD-10-CM | POA: Insufficient documentation

## 2021-12-01 DIAGNOSIS — R079 Chest pain, unspecified: Secondary | ICD-10-CM

## 2021-12-01 HISTORY — DX: Chest pain, unspecified: R07.9

## 2021-12-01 LAB — CBC WITH DIFFERENTIAL/PLATELET
Abs Immature Granulocytes: 0.01 10*3/uL (ref 0.00–0.07)
Basophils Absolute: 0.1 10*3/uL (ref 0.0–0.1)
Basophils Relative: 1 %
Eosinophils Absolute: 0.1 10*3/uL (ref 0.0–0.5)
Eosinophils Relative: 2 %
HCT: 46.5 % (ref 39.0–52.0)
Hemoglobin: 15 g/dL (ref 13.0–17.0)
Immature Granulocytes: 0 %
Lymphocytes Relative: 33 %
Lymphs Abs: 2.3 10*3/uL (ref 0.7–4.0)
MCH: 28.5 pg (ref 26.0–34.0)
MCHC: 32.3 g/dL (ref 30.0–36.0)
MCV: 88.2 fL (ref 80.0–100.0)
Monocytes Absolute: 0.5 10*3/uL (ref 0.1–1.0)
Monocytes Relative: 7 %
Neutro Abs: 3.9 10*3/uL (ref 1.7–7.7)
Neutrophils Relative %: 57 %
Platelets: 272 10*3/uL (ref 150–400)
RBC: 5.27 MIL/uL (ref 4.22–5.81)
RDW: 12.3 % (ref 11.5–15.5)
WBC: 6.9 10*3/uL (ref 4.0–10.5)
nRBC: 0 % (ref 0.0–0.2)

## 2021-12-01 LAB — BASIC METABOLIC PANEL
Anion gap: 5 (ref 5–15)
BUN: 12 mg/dL (ref 6–20)
CO2: 27 mmol/L (ref 22–32)
Calcium: 9.3 mg/dL (ref 8.9–10.3)
Chloride: 108 mmol/L (ref 98–111)
Creatinine, Ser: 1.07 mg/dL (ref 0.61–1.24)
GFR, Estimated: >60 mL/min (ref >60)
Glucose, Bld: 98 mg/dL (ref 70–99)
Potassium: 4.1 mmol/L (ref 3.5–5.1)
Sodium: 140 mmol/L (ref 135–145)

## 2021-12-01 LAB — TROPONIN I (HIGH SENSITIVITY)
Troponin I (High Sensitivity): 2 ng/L (ref <18)
Troponin I (High Sensitivity): 3 ng/L (ref <18)

## 2021-12-01 MED ORDER — HYDROXYZINE HCL 25 MG PO TABS: 25.0000 mg | ORAL_TABLET | Freq: Four times a day (QID) | ORAL | 0 refills | Status: AC

## 2021-12-01 NOTE — ED Triage Notes (Signed)
Pt reports chest pain comes and goes X1 day.  "Pain is sometimes in on the left and sometimes in the middle but moved to my stomach".  "It might be stress."

## 2021-12-01 NOTE — Discharge Instructions (Signed)
Your work-up today was quite reassuring.  I have prescribed you some hydroxyzine to take when you are having symptoms with the understanding that you will need to take a moment to slow breathe and sit down and relax.  If you have any new or concerning symptoms however such as worsening chest pain, difficulty breathing, coughing up blood, fevers or any other emergent symptoms, immediately back to the emergency room--these are generic return precautions.

## 2021-12-01 NOTE — ED Provider Triage Note (Signed)
Emergency Medicine Provider Triage Evaluation Note  [place] , a 26 y.o. male  was evaluated in triage.  Pt complains of chest pain.  States intermittent over the past few days.  Seen here yesterday for same but left prior to being seen.  He states pain continues.  Smokes marijuana.  Denies prior cardiac hx.  No cough/fever.  Review of Systems  Positive: Chest pain Negative: Cough, fever  Physical Exam  BP 120/79 (BP Location: Right Arm)   Pulse 60   Temp 98.4 F (36.9 C) (Oral)   Resp 18   SpO2 100%  Gen:   Awake, no distress   Resp:  Normal effort  MSK:   Moves extremities without difficulty  Other:  Smells of marijuana  Medical Decision Making  Medically screening exam initiated at 2:03 AM.  Appropriate orders placed.  [place] was informed that the remainder of the evaluation will be completed by another provider, this initial triage assessment does not replace that evaluation, and the importance of remaining in the ED until their evaluation is complete.  Chest pain.  EKG, labs, CXR.   Larene Pickett, PA-C 12/01/21 [phone]

## 2021-12-01 NOTE — ED Notes (Signed)
Patient verbalizes understanding of discharge instructions. Opportunity for questioning and answers were provided. Pt discharged from ED. 

## 2021-12-01 NOTE — ED Provider Notes (Signed)
[facility] EMERGENCY DEPARTMENT Provider Note   CSN: [id number] Arrival date & time: 12/01/21  0119     History Chief Complaint  Patient presents with   Chest Pain    Howard Rodriguez is a 26 y.o. male.   Chest Pain  Patient is a 26 year old male with no pertinent past medical history presented emergency room today with complaints of chest pain that he describes as more of a tightness that has been ongoing for the past 3 days now.  He states that his symptoms began moments after he got an argument with a family member.  He states he currently does not have any chest pain.  He does not have any shortness of breath nausea or vomiting.  He denies any lower extremity swelling calf pain hemoptysis no recent surgeries, immobilization, flights or long distance travel.  He does smoke marijuana occasionally but no other smoking or recreational drug use.  He states that when he focuses on the pain in his chest when it occurs he feels that it becomes worse.  If he distracts himself he finds that the pain completely resolves.     Home Medications Prior to Admission medications   Medication Sig Start Date End Date Taking? Authorizing Provider  hydrOXYzine (ATARAX) 25 MG tablet Take 1 tablet (25 mg total) by mouth every 6 (six) hours. 12/01/21  Yes Jalien Weakland, Ova Fresh[place] S, PA  acetaminophen (NON-ASPIRIN) 500 MG tablet Take 2,000 mg by mouth every 6 (six) hours as needed for mild pain.    [provider]  cephALEXin (KEFLEX) 500 MG capsule Take 1 capsule (500 mg total) by mouth 4 (four) times daily. 02/20/21   Antonietta Breach, PA-C  EPINEPHrine 0.3 mg/0.3 mL IJ SOAJ injection Inject 0.3 mg into the muscle as needed for anaphylaxis. 10/28/21   Larene Pickett, PA-C  ibuprofen (ADVIL) 600 MG tablet Take 1 tablet (600 mg total) by mouth every 6 (six) hours as needed for up to 30 doses. Patient not taking: Reported on 02/20/2021 08/13/20   Wyvonnia Dusky, MD  naproxen (NAPROSYN)  500 MG tablet Take 1 tablet (500 mg total) by mouth 2 (two) times daily. 02/20/21   Antonietta Breach, PA-C  predniSONE (DELTASONE) 20 MG tablet Take 40 mg by mouth daily for 3 days, then 20mg  by mouth daily for 3 days, then 10mg  daily for 3 days 10/28/21   Larene Pickett, PA-C      Allergies    Other    Review of Systems   Review of Systems  Cardiovascular:  Positive for chest pain.    Physical Exam Updated Vital Signs BP 129/77   Pulse 64   Temp 99.4 F (37.4 C) (Oral)   Resp 16   Ht 6\' 1"  (1.854 m)   SpO2 100%   BMI 19.13 kg/m  Physical Exam Vitals and nursing note reviewed.  Constitutional:      General: He is not in acute distress.    Comments: Pleasant well-appearing 26 year old.  In no acute distress.  Sitting comfortably in bed.  Able answer questions appropriately follow commands. No increased work of breathing. Speaking in full sentences.   HENT:     Head: Normocephalic and atraumatic.     Nose: Nose normal.  Eyes:     General: No scleral icterus. Cardiovascular:     Rate and Rhythm: Normal rate and regular rhythm.     Pulses: Normal pulses.     Heart sounds: Normal heart sounds.  Pulmonary:  Effort: Pulmonary effort is normal. No respiratory distress.     Breath sounds: No wheezing.  Abdominal:     Palpations: Abdomen is soft.     Tenderness: There is no abdominal tenderness.  Musculoskeletal:     Cervical back: Normal range of motion.     Right lower leg: No edema.     Left lower leg: No edema.  Skin:    General: Skin is warm and dry.     Capillary Refill: Capillary refill takes less than 2 seconds.  Neurological:     Mental Status: He is alert. Mental status is at baseline.  Psychiatric:        Mood and Affect: Mood normal.        Behavior: Behavior normal.     ED Results / Procedures / Treatments   Labs (all labs ordered are listed, but only abnormal results are displayed) Labs Reviewed  CBC WITH DIFFERENTIAL/PLATELET  BASIC METABOLIC PANEL   TROPONIN I (HIGH SENSITIVITY)  TROPONIN I (HIGH SENSITIVITY)    EKG None  Radiology DG Chest 2 View  Result Date: 12/01/2021 CLINICAL DATA:  Chest pain EXAM: CHEST - 2 VIEW COMPARISON:  11/30/2021 FINDINGS: The heart size and mediastinal contours are within normal limits. Both lungs are clear. The visualized skeletal structures are unremarkable. IMPRESSION: No acute abnormality noted.  No change from the previous day. Electronically Signed   By: Inez Catalina M.D.   On: 12/01/2021 02:46   DG Chest 2 View  Result Date: 11/30/2021 CLINICAL DATA:  Chest pain and shortness of breath EXAM: CHEST - 2 VIEW COMPARISON:  03/11/2021 FINDINGS: The heart size and mediastinal contours are within normal limits. Both lungs are clear. The visualized skeletal structures are unremarkable. IMPRESSION: No active cardiopulmonary disease. Electronically Signed   By: Inez Catalina M.D.   On: 11/30/2021 03:20    Procedures Procedures    Medications Ordered in ED Medications - No data to display  ED Course/ Medical Decision Making/ A&P                           Medical Decision Making  Patient is a 26 year old male with no pertinent past medical history presented emergency room today with complaints of chest pain that he describes as more of a tightness that has been ongoing for the past 3 days now.  He states that his symptoms began moments after he got an argument with a family member.  He states he currently does not have any chest pain.  He does not have any shortness of breath nausea or vomiting.  He denies any lower extremity swelling calf pain hemoptysis no recent surgeries, immobilization, flights or long distance travel.  He does smoke marijuana occasionally but no other smoking or recreational drug use.  He states that when he focuses on the pain in his chest when it occurs he feels that it becomes worse.  If he distracts himself he finds that the pain completely resolves.   Physical exam  unremarkable  Chest x-ray personally viewed by me agree with radiology read no pneumothorax or acute abnormal finding.  BMP CBC troponin x2 within normal limits Patient had 2 troponins drawn yesterday as well which were also normal.  Patient is PERC negative low suspicion for PE.  EKG w abn V2 with a slight saddle-back appearance concerning enough for brugada to refer to cardiology -patient has had no syncopal episodes. Discussed with Dr. Fransico Him of cardiology -  she recommends follow-up with cardiology EP she will ensure follow-up  Given his symptoms and his description of the context of his symptoms I have a high suspicion that this is related to anxiety which is what the patient believes.  Doubt PE, pneumothorax, dissection.   Final Clinical Impression(s) / ED Diagnoses Final diagnoses:  Chest pain, unspecified type    Rx / DC Orders ED Discharge Orders          Ordered    hydrOXYzine (ATARAX) 25 MG tablet  Every 6 hours        12/01/21 0924    Ambulatory referral to Cardiology       Comments: If you have not heard from the Cardiology office within the next 72 hours please call [phone].   12/01/21 [place], Kenderick Kobler S, PA 12/01/21 1146    Audley Hose, MD 12/01/21 [phone]

## 2021-12-03 NOTE — Progress Notes (Unsigned)
Cardiology Office Note:    Date:  12/04/2021   ID:  [place], DOB 28-May-1995, MRN [id number]  PCP:  Patient, No Pcp Per   [facility] Providers Cardiologist:  Alverda Skeans, MD Referring MD: Gailen Shelter, [place]   Chief Complaint/Reason for Referral: Chest pain     ASSESSMENT:    1. Precordial pain     PLAN:    In order of problems listed above: 1.  Chest pain: We will obtain exercise echo stress test to evaluate further.  We will keep follow-up open-ended depending on the results of this test.         Shared Decision Making/Informed Consent The risks [chest pain, shortness of breath, cardiac arrhythmias, dizziness, blood pressure fluctuations, myocardial infarction, stroke/transient ischemic attack, and life-threatening complications (estimated to be 1 in 10,000)], benefits (risk stratification, diagnosing coronary artery disease, treatment guidance) and alternatives of a stress or dobutamine stress echocardiogram were discussed in detail with Mr. [place] and he agrees to proceed.   Dispo:  Return if symptoms worsen or fail to improve.      Medication Adjustments/Labs and Tests Ordered: Current medicines are reviewed at length with the patient today.  Concerns regarding medicines are outlined above.  The following changes have been made:  no change   Labs/tests ordered: Orders Placed This Encounter  Procedures   ECHOCARDIOGRAM STRESS TEST    Medication Changes: No orders of the defined types were placed in this encounter.    Current medicines are reviewed at length with the patient today.  The patient does not have concerns regarding medicines.   History of Present Illness:    FOCUSED PROBLEM LIST:   1.  Anxiety  The patient is a 26 y.o. male with the indicated medical history here for recommendations regarding chest pain.  The patient was seen in the emergency department recently due to 3 days worth of ongoing chest tightness.  Apparently  this happened after he got an argument with his family member.  He did not have any chest pain while he was at the emergency department.  The pain seems to be modulated by him concentrating on whether he is having pain or not.  His evaluation including serial troponins, chest x-ray, and EKG which were reassuring.  The patient tells me that occasionally he will get chest discomfort when he is sitting but sometimes when he is exercising.  Its been occurring for about a year.  Sometimes it happens after he smokes marijuana.  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  He does not smoke tobacco or use any other drugs.  He denies any shortness of breath, presyncope, syncope, palpitations, paroxysmal nocturnal dyspnea, orthopnea.         Current Medications: Current Meds  Medication Sig   hydrOXYzine (ATARAX) 25 MG tablet Take 1 tablet (25 mg total) by mouth every 6 (six) hours.     Allergies:    Other   Social History:   Social History   Tobacco Use   Smoking status: Every Day   Smokeless tobacco: Never  Substance Use Topics   Drug use: Yes    Types: Marijuana    Comment: pt uses once or twice a day     Family Hx: History reviewed. No pertinent family history.   Review of Systems:   Please see the history of present illness.    All other systems reviewed and are negative.     EKGs/Labs/Other Test Reviewed:    EKG:  EKG  performed October 2023 that I personally reviewed demonstrates sinus rhythm with incomplete right bundle branch block and repolarization abnormality  Prior CV studies: None available  Other studies Reviewed: Review of the additional studies/records demonstrates: None relevant  Recent Labs: 11/30/2021: ALT 35 12/01/2021: BUN 12; Creatinine, Ser 1.07; Hemoglobin 15.0; Platelets 272; Potassium 4.1; Sodium 140   Recent Lipid Panel No results found for: "CHOL", "TRIG", "HDL", "LDLCALC", "LDLDIRECT"  Risk Assessment/Calculations:                 Physical Exam:    VS:  BP 126/80   Pulse 86   Ht 6\' 1"  (1.854 m)   Wt 152 lb 12.8 oz (69.3 kg)   SpO2 98%   BMI 20.16 kg/m    Wt Readings from Last 3 Encounters:  12/04/21 152 lb 12.8 oz (69.3 kg)  03/11/21 145 lb (65.8 kg)    GENERAL:  No apparent distress, AOx3 HEENT:  No carotid bruits, +2 carotid impulses, no scleral icterus CAR: RRR no murmurs, gallops, rubs, or thrills RES:  Clear to auscultation bilaterally ABD:  Soft, nontender, nondistended, positive bowel sounds x 4 VASC:  +2 radial pulses, +2 carotid pulses, palpable pedal pulses NEURO:  CN 2-12 grossly intact; motor and sensory grossly intact PSYCH:  No active depression or anxiety EXT:  No edema, ecchymosis, or cyanosis  Signed, Early Osmond, MD  12/04/2021 3:56 PM    [address], [place], [place]  [address] Phone: [phone]; Fax: [phone]   Note:  This document was prepared using Dragon voice recognition software and may include unintentional dictation errors.

## 2021-12-04 ENCOUNTER — Ambulatory Visit: Payer: PRIVATE HEALTH INSURANCE | Attending: Internal Medicine | Admitting: Internal Medicine

## 2021-12-04 ENCOUNTER — Encounter: Payer: Self-pay | Admitting: Internal Medicine

## 2021-12-04 VITALS — BP 126/80 | HR 86 | Ht 73.0 in | Wt 152.8 lb

## 2021-12-04 DIAGNOSIS — R072 Precordial pain: Secondary | ICD-10-CM

## 2021-12-04 NOTE — Patient Instructions (Signed)
Medication Instructions:  No changes *If you need a refill on your cardiac medications before your next appointment, please call your pharmacy*   Lab Work: none If you have labs (blood work) drawn today and your tests are completely normal, you will receive your results only by: [facility] (if you have MyChart) OR A paper copy in the mail If you have any lab test that is abnormal or we need to change your treatment, we will call you to review the results.   Testing/Procedures: Your physician has requested that you have a stress echocardiogram. For further information please visit [email or web address]. Please follow instruction sheet as given.   Follow-Up: As needed  Important Information About Sugar

## 2021-12-11 ENCOUNTER — Telehealth (HOSPITAL_COMMUNITY): Payer: Self-pay | Admitting: *Deleted

## 2021-12-11 NOTE — Telephone Encounter (Signed)
Patient given detailed instructions per Stress Test Requisition Sheet for test on 12/18/21 at 2:00.Patient Notified to arrive 30 minutes early, and that it is imperative to arrive on time for appointment to keep from having the test rescheduled.  Patient verbalized understanding. Howard Rodriguez

## 2021-12-13 ENCOUNTER — Emergency Department (HOSPITAL_COMMUNITY): Payer: PRIVATE HEALTH INSURANCE

## 2021-12-13 ENCOUNTER — Encounter (HOSPITAL_COMMUNITY): Payer: Self-pay | Admitting: Emergency Medicine

## 2021-12-13 ENCOUNTER — Other Ambulatory Visit: Payer: Self-pay

## 2021-12-13 ENCOUNTER — Emergency Department (HOSPITAL_COMMUNITY)
Admission: EM | Admit: 2021-12-13 | Discharge: 2021-12-13 | Disposition: A | Discharge status: Other Acute Inpatient Hospital | Payer: PRIVATE HEALTH INSURANCE | Attending: Emergency Medicine | Admitting: Emergency Medicine

## 2021-12-13 DIAGNOSIS — R0789 Other chest pain: Secondary | ICD-10-CM | POA: Insufficient documentation

## 2021-12-13 DIAGNOSIS — Z91040 Latex allergy status: Secondary | ICD-10-CM | POA: Insufficient documentation

## 2021-12-13 LAB — BASIC METABOLIC PANEL
Anion gap: 6 (ref 5–15)
BUN: 11 mg/dL (ref 6–20)
CO2: 24 mmol/L (ref 22–32)
Calcium: 8.8 mg/dL — ABNORMAL LOW (ref 8.9–10.3)
Chloride: 109 mmol/L (ref 98–111)
Creatinine, Ser: 1.01 mg/dL (ref 0.61–1.24)
GFR, Estimated: >60 mL/min (ref >60)
Glucose, Bld: 106 mg/dL — ABNORMAL HIGH (ref 70–99)
Potassium: 3.5 mmol/L (ref 3.5–5.1)
Sodium: 139 mmol/L (ref 135–145)

## 2021-12-13 LAB — CBC
HCT: 41.5 % (ref 39.0–52.0)
Hemoglobin: 14.1 g/dL (ref 13.0–17.0)
MCH: 28.8 pg (ref 26.0–34.0)
MCHC: 34 g/dL (ref 30.0–36.0)
MCV: 84.7 fL (ref 80.0–100.0)
Platelets: 256 10*3/uL (ref 150–400)
RBC: 4.9 MIL/uL (ref 4.22–5.81)
RDW: 11.9 % (ref 11.5–15.5)
WBC: 5.9 10*3/uL (ref 4.0–10.5)
nRBC: 0 % (ref 0.0–0.2)

## 2021-12-13 LAB — TROPONIN I (HIGH SENSITIVITY)
Troponin I (High Sensitivity): 6 ng/L (ref <18)
Troponin I (High Sensitivity): 4 ng/L (ref <18)

## 2021-12-13 MED ORDER — KETOROLAC TROMETHAMINE 15 MG/ML IJ SOLN
15.0000 mg | Freq: Once | INTRAMUSCULAR | Status: AC
  Administered 2021-12-13: 15 mg via INTRAMUSCULAR
  Filled 2021-12-13: qty 1

## 2021-12-13 NOTE — ED Provider Notes (Signed)
[facility] Provider Note   CSN: [id number] Arrival date & time: 12/13/21  0207     History  Chief Complaint  Patient presents with   Chest Pain    [facility] is a 26 y.o. male.  26 year old male presents with complaint of chest pain.   Pain is intermittent and sharp over left lateral chest wall.  Pain worse with movement.   Patient reports similar pain in past secondary to anxiety.   He also lifted weights yesterday - which is unusual for him.  The history is provided by the patient and medical records.  Chest Pain      Home Medications Prior to Admission medications   Medication Sig Start Date End Date Taking? Authorizing Provider  aspirin 325 MG tablet Take 325 mg by mouth once as needed (chest pain).   Yes [provider]  hydrOXYzine (ATARAX) 25 MG tablet Take 1 tablet (25 mg total) by mouth every 6 (six) hours. Patient taking differently: Take 25 mg by mouth every 6 (six) hours as needed for anxiety. 12/01/21  Yes Pati Gallo S, PA      Allergies    Latex and Shellfish allergy    Review of Systems   Review of Systems  Cardiovascular:  Positive for chest pain.  All other systems reviewed and are negative.   Physical Exam Updated Vital Signs BP 118/71   Pulse (!) 47   Temp 97.9 F (36.6 C)   Resp 14   SpO2 100%  Physical Exam Vitals and nursing note reviewed.  Constitutional:      General: He is not in acute distress.    Appearance: Normal appearance. He is well-developed.  HENT:     Head: Normocephalic and atraumatic.  Eyes:     Conjunctiva/sclera: Conjunctivae normal.     Pupils: Pupils are equal, round, and reactive to light.  Cardiovascular:     Rate and Rhythm: Normal rate and regular rhythm.     Heart sounds: Normal heart sounds.  Pulmonary:     Effort: Pulmonary effort is normal. No respiratory distress.     Breath sounds: Normal breath sounds.  Chest:     Chest wall:  Tenderness present.     Comments: Mild tenderness to anterior left chest wall which is reproduced with palpation Abdominal:     General: There is no distension.     Palpations: Abdomen is soft.     Tenderness: There is no abdominal tenderness.  Musculoskeletal:        General: No deformity. Normal range of motion.     Cervical back: Normal range of motion and neck supple.  Skin:    General: Skin is warm and dry.  Neurological:     General: No focal deficit present.     Mental Status: He is alert and oriented to person, place, and time.     ED Results / Procedures / Treatments   Labs (all labs ordered are listed, but only abnormal results are displayed) Labs Reviewed  BASIC METABOLIC PANEL - Abnormal; Notable for the following components:      Result Value   Glucose, Bld 106 (*)    Calcium 8.8 (*)    All other components within normal limits  CBC  TROPONIN I (HIGH SENSITIVITY)  TROPONIN I (HIGH SENSITIVITY)    EKG EKG Interpretation  Date/Time:  Wednesday December 13 2021 02:13:12 EDT Ventricular Rate:  59 PR Interval:  176 QRS Duration: 96 QT Interval:  402 QTC Calculation: 397 R Axis:   86 Text Interpretation: Sinus bradycardia Incomplete right bundle branch block Borderline ECG Confirmed by Zadie Rhine ([id number]) on 12/13/2021 2:19:26 AM  Radiology DG Chest 2 View  Result Date: 12/13/2021 CLINICAL DATA:  Chest pain 12/01/2021 EXAM: CHEST - 2 VIEW COMPARISON:  None Available. FINDINGS: The heart size and mediastinal contours are within normal limits. Both lungs are clear. The visualized skeletal structures are unremarkable. IMPRESSION: No active cardiopulmonary disease. Electronically Signed   By: Charlett Nose M.D.   On: 12/13/2021 02:44    Procedures Procedures    Medications Ordered in ED Medications  ketorolac (TORADOL) 15 MG/ML injection 15 mg (15 mg Intramuscular Given 12/13/21 1116)    ED Course/ Medical Decision Making/ A&P                            Medical Decision Making Amount and/or Complexity of Data Reviewed Labs: ordered. Radiology: ordered.  Risk Prescription drug management.    Medical Screen Complete  This patient presented to the ED with complaint of chest pain.  This complaint involves an extensive number of treatment options. The initial differential diagnosis includes, but is not limited to, ACS, MSK pain, metabolic abnormality, etc.  This presentation is: Acute, Self-Limited, Previously Undiagnosed, Uncertain Prognosis, Complicated, Systemic Symptoms, and Threat to Life/Bodily Function  Patient with atypical chest pain.  EKG without acute ischemia. Trop x 2 nearly undetectable.  Other screening labs without significant abnormality.  Patient reassured by workup findings.   Importance of close FU stressed. Strict return precautions given and understood.  Additional history obtained:  External records from outside sources obtained and reviewed including prior ED visits and prior Inpatient records.    Lab Tests:  I ordered and personally interpreted labs.  The pertinent results include:  cbc bmp trop   Imaging Studies ordered:  I ordered imaging studies including chest xr  I independently visualized and interpreted obtained imaging which showed NAD I agree with the radiologist interpretation.   Cardiac Monitoring:  The patient was maintained on a cardiac monitor.  I personally viewed and interpreted the cardiac monitor which showed an underlying rhythm of: NSR   Medicines ordered:  I ordered medication including toradol  for pain  Reevaluation of the patient after these medicines showed that the patient: improved   Problem List / ED Course:  Atypical chest pain   Reevaluation:  After the interventions noted above, I reevaluated the patient and found that they have: improved   Disposition:  After consideration of the diagnostic results and the patients response to treatment, I feel  that the patent would benefit from close outpatient followup.          Final Clinical Impression(s) / ED Diagnoses Final diagnoses:  Atypical chest pain    Rx / DC Orders ED Discharge Orders     None         Wynetta Fines, MD 12/13/21 2259

## 2021-12-13 NOTE — ED Notes (Signed)
AVS provided to and discussed with patient. Pt verbalizes understanding of discharge instructions and denies any questions or concerns at this time. Pt ambulated out of department independently with steady gait.  

## 2021-12-13 NOTE — ED Triage Notes (Signed)
Patient reports left chest pain with mild SOB this evening , no emesis or diaphoresis .  

## 2021-12-13 NOTE — Discharge Instructions (Addendum)
Return for any problem.   Follow-up with cardiology as previously scheduled.

## 2021-12-18 ENCOUNTER — Ambulatory Visit (HOSPITAL_COMMUNITY): Payer: PRIVATE HEALTH INSURANCE

## 2021-12-18 ENCOUNTER — Ambulatory Visit (HOSPITAL_COMMUNITY): Payer: PRIVATE HEALTH INSURANCE | Attending: Cardiology

## 2021-12-18 DIAGNOSIS — R072 Precordial pain: Secondary | ICD-10-CM | POA: Insufficient documentation

## 2021-12-18 LAB — ECHOCARDIOGRAM STRESS TEST
Area-P 1/2: 3.31 cm2
S' Lateral: 2.8 cm

## 2021-12-18 MED ORDER — PERFLUTREN LIPID MICROSPHERE
1.0000 mL | INTRAVENOUS | Status: AC | PRN
  Administered 2021-12-18: 2 mL via INTRAVENOUS
  Administered 2021-12-18: 5 mL via INTRAVENOUS

## 2022-07-03 IMAGING — CR DG CHEST 2V
2 series · 2 of 2 positions shown · non-contrast
Comparison: None.

CLINICAL DATA: Chest pain and short of breath

EXAM:
CHEST - 2 VIEW

[chest pa]
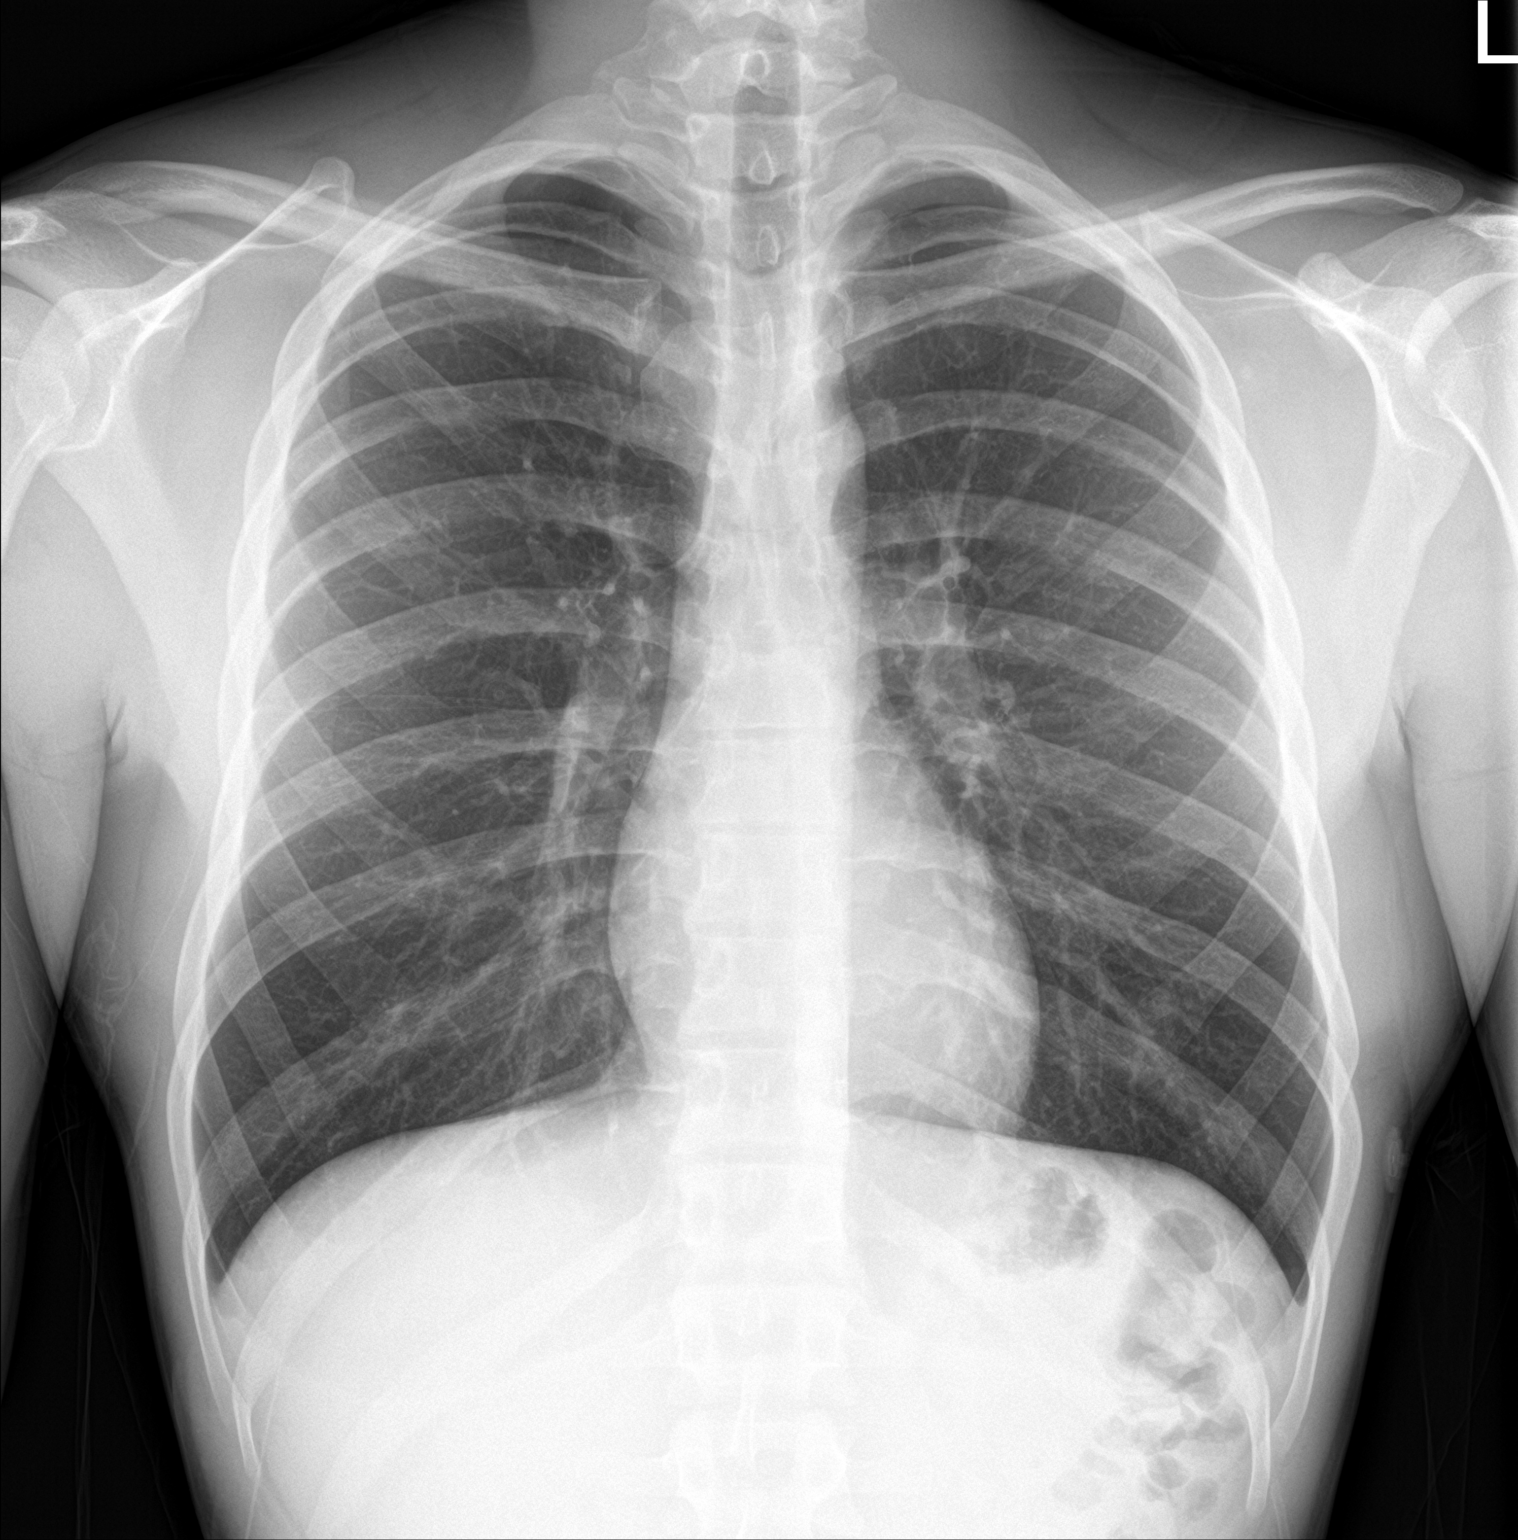

[chest lat]
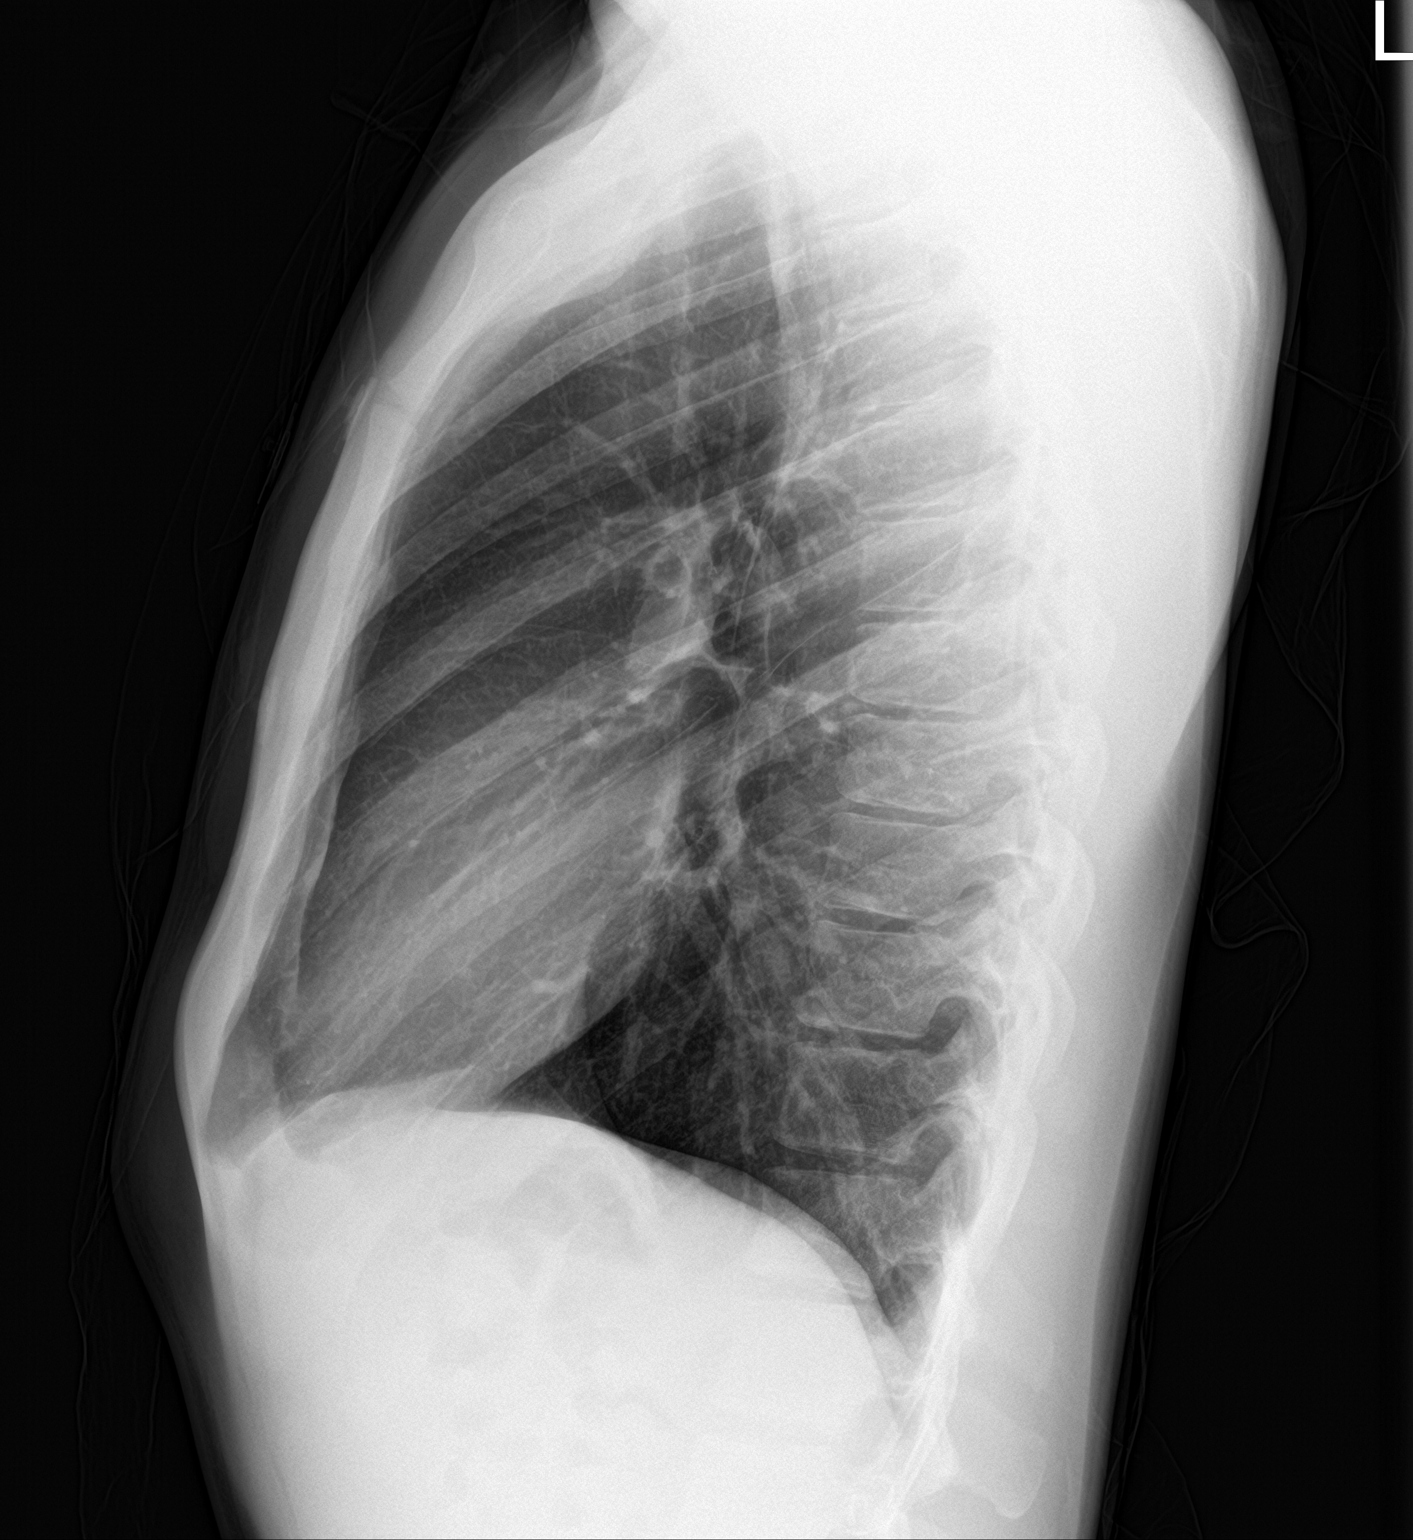

[2 of 2 positions shown; findings below may reference images not displayed]

FINDINGS: The heart size and mediastinal contours are within normal limits.
Both lungs are clear. The visualized skeletal structures are
unremarkable.
IMPRESSION: No active cardiopulmonary disease.

## 2022-07-19 ENCOUNTER — Emergency Department (HOSPITAL_COMMUNITY): Payer: Managed Care, Other (non HMO)

## 2022-07-19 ENCOUNTER — Encounter (HOSPITAL_COMMUNITY): Payer: Self-pay | Admitting: Emergency Medicine

## 2022-07-19 ENCOUNTER — Emergency Department (HOSPITAL_COMMUNITY)
Admission: EM | Admit: 2022-07-19 | Discharge: 2022-07-19 | Disposition: A | Discharge status: Home / Self Care | Payer: Managed Care, Other (non HMO) | Attending: Emergency Medicine | Admitting: Emergency Medicine

## 2022-07-19 ENCOUNTER — Other Ambulatory Visit: Payer: Self-pay

## 2022-07-19 DIAGNOSIS — S0990XA Unspecified injury of head, initial encounter: Secondary | ICD-10-CM | POA: Diagnosis present

## 2022-07-19 DIAGNOSIS — Z7982 Long term (current) use of aspirin: Secondary | ICD-10-CM | POA: Diagnosis not present

## 2022-07-19 DIAGNOSIS — Y9301 Activity, walking, marching and hiking: Secondary | ICD-10-CM | POA: Diagnosis not present

## 2022-07-19 DIAGNOSIS — W010XXA Fall on same level from slipping, tripping and stumbling without subsequent striking against object, initial encounter: Secondary | ICD-10-CM | POA: Diagnosis not present

## 2022-07-19 DIAGNOSIS — Z91040 Latex allergy status: Secondary | ICD-10-CM | POA: Diagnosis not present

## 2022-07-19 DIAGNOSIS — S0101XA Laceration without foreign body of scalp, initial encounter: Secondary | ICD-10-CM | POA: Diagnosis not present

## 2022-07-19 DIAGNOSIS — W19XXXA Unspecified fall, initial encounter: Secondary | ICD-10-CM

## 2022-07-19 MED ORDER — KETOROLAC TROMETHAMINE 30 MG/ML IJ SOLN
30.0000 mg | Freq: Once | INTRAMUSCULAR | Status: AC
  Administered 2022-07-19: 30 mg via INTRAMUSCULAR
  Filled 2022-07-19: qty 1

## 2022-07-19 NOTE — ED Provider Notes (Signed)
[place] EMERGENCY DEPARTMENT AT [facility] Provider Note   CSN: [id number] Arrival date & time: 07/19/22  0113     History  Chief Complaint  Patient presents with   [facility] is a 27 y.o. male.  HPI   Patient without significant medical history presenting with complaints of a mechanical fall.  States that he was walking outside tripped and caused him to fall onto his right side.  States he did strike his head but denies loss of consciousness, he is not on anticoag's.  Patient denies headaches, change in vision, paresthesias or weakness of the upper lower extremities, he denies  neck pain back pain chest pain shortness of breath no stomach pain nausea or vomiting.  No pain in the upper and lower extremities.  Patient is up-to-date on his tetanus shot.  Home Medications Prior to Admission medications   Medication Sig Start Date End Date Taking? Authorizing Provider  aspirin 325 MG tablet Take 325 mg by mouth once as needed (chest pain).    [provider]  hydrOXYzine (ATARAX) 25 MG tablet Take 1 tablet (25 mg total) by mouth every 6 (six) hours. Patient taking differently: Take 25 mg by mouth every 6 (six) hours as needed for anxiety. 12/01/21   Gailen Shelter, PA      Allergies    Latex and Shellfish allergy    Review of Systems   Review of Systems  Constitutional:  Negative for chills and fever.  Respiratory:  Negative for shortness of breath.   Cardiovascular:  Negative for chest pain.  Gastrointestinal:  Negative for abdominal pain.  Skin:  Positive for wound.  Neurological:  Negative for headaches.    Physical Exam Updated Vital Signs BP 102/78 (BP Location: Right Arm)   Pulse 61   Temp 98 F (36.7 C) (Oral)   Resp 16   Ht 6\' 1"  (1.854 m)   Wt 72.6 kg   SpO2 100%   BMI 21.11 kg/m  Physical Exam Vitals and nursing note reviewed.  Constitutional:      General: He is not in acute distress.    Appearance: He is not  ill-appearing.  HENT:     Head: Normocephalic and atraumatic.     Comments: Patient is a small laceration noted beneath the right temporal lobe, approximately half centimeter in length about 0.2 cm in depth slightly gaping but with clean borders.  Hemodynamically stable.  No noted raccoon eyes or Battle sign noted.    Nose: No congestion.     Mouth/Throat:     Mouth: Mucous membranes are moist.     Pharynx: Oropharynx is clear.     Comments: No trismus no torticollis no oral trauma present. Eyes:     Extraocular Movements: Extraocular movements intact.     Conjunctiva/sclera: Conjunctivae normal.     Pupils: Pupils are equal, round, and reactive to light.  Cardiovascular:     Rate and Rhythm: Normal rate and regular rhythm.     Pulses: Normal pulses.     Heart sounds: No murmur heard.    No friction rub. No gallop.  Pulmonary:     Effort: No respiratory distress.     Breath sounds: No wheezing, rhonchi or rales.     Comments: No noted trauma the chest chest was nontender lung sounds clear bilaterally Abdominal:     Palpations: Abdomen is soft.     Tenderness: There is no abdominal tenderness. There is no right CVA  tenderness or left CVA tenderness.     Comments: No noted trauma of the abdomen abdomen soft nontender  Musculoskeletal:     Comments: Spine was palpated was nontender to palpation no step-off deformities noted no pelvis instability no leg shortening.  Skin:    General: Skin is warm and dry.  Neurological:     Mental Status: He is alert.     Comments: No facial asymmetry no difficulty with word finding, following two-step commands there is no unilateral weakness present.  Psychiatric:        Mood and Affect: Mood normal.     ED Results / Procedures / Treatments   Labs (all labs ordered are listed, but only abnormal results are displayed) Labs Reviewed - No data to display  EKG None  Radiology CT Maxillofacial Wo Contrast  Result Date: 07/19/2022 CLINICAL  DATA:  27 year old male status post fall onto bricks. Right cheek laceration. Headache and dizziness. EXAM: CT MAXILLOFACIAL WITHOUT CONTRAST TECHNIQUE: Multidetector CT imaging of the maxillofacial structures was performed. Multiplanar CT image reconstructions were also generated. RADIATION DOSE REDUCTION: This exam was performed according to the departmental dose-optimization program which includes automated exposure control, adjustment of the mA and/or kV according to patient size and/or use of iterative reconstruction technique. COMPARISON:  Head CT today.  Previous face CT 02/20/2021. FINDINGS: Osseous: Chronic mandible symphysis and bilateral mandible subcondylar ORIF appears stable with no adverse features. Bilateral TMJ remain aligned. Elongated bilateral stylohyoid ligament calcification appears unchanged, more extensive on the right. Healing of the right maxillary sinus fractures since 2022. No acute maxilla fracture. Bilateral zygoma appear intact. Bilateral pterygoid bones appear intact. No acute nasal bone fracture identified. Central skull base and visible cervical vertebrae appear intact. Orbits: Chronic right lamina papyracea fracture. Chronic right orbital floor fracture. No acute orbital fracture. Bilateral globes and intraorbital soft tissues now are within normal limits. Sinuses: Largely clear throughout.  No sinus fluid levels. Soft tissues: Negative visible noncontrast larynx, pharynx, parapharyngeal spaces, retropharyngeal space, sublingual space, submandibular spaces, masticator and parotid spaces. Mild superficial soft tissue swelling and stranding along the right lateral face, at and just above the zygoma. No soft tissue gas. No other discrete superficial soft tissue injury identified. No evidence upper cervical lymphadenopathy. Limited intracranial: Negative. IMPRESSION: 1. Mild superficial soft tissue swelling along the right lateral face. 2. No acute facial fracture. Multiple chronic  face and orbit fractures, chronic bilateral mandible ORIF. Electronically Signed   By: Odessa Fleming M.D.   On: 07/19/2022 04:26   CT Head Wo Contrast  Result Date: 07/19/2022 CLINICAL DATA:  27 year old male status post fall onto bricks. Right cheek laceration. Headache and dizziness. EXAM: CT HEAD WITHOUT CONTRAST TECHNIQUE: Contiguous axial images were obtained from the base of the skull through the vertex without intravenous contrast. RADIATION DOSE REDUCTION: This exam was performed according to the departmental dose-optimization program which includes automated exposure control, adjustment of the mA and/or kV according to patient size and/or use of iterative reconstruction technique. COMPARISON:  Face CT today reported separately. Prior face CT 02/20/2021. FINDINGS: Brain: Normal cerebral volume. No midline shift, ventriculomegaly, mass effect, evidence of mass lesion, intracranial hemorrhage or evidence of cortically based acute infarction. Gray-white matter differentiation is within normal limits throughout the brain. Vascular: No suspicious intracranial vascular hyperdensity. Skull: Chronic right lamina papyracea fracture. No acute skull fracture identified. Sinuses/Orbits: Improved sinus aeration compared to 2022. No visible sinus fluid levels. Other: Orbits minimally included, reported separately. No discrete scalp soft tissue injury  identified. See face CT reported separately. IMPRESSION: 1. Normal noncontrast CT appearance of the brain. 2. No scalp or skull acute traumatic injury identified. See Face CT reported separately. Electronically Signed   By: Odessa Fleming M.D.   On: 07/19/2022 04:20    Procedures .Marland KitchenLaceration Repair  Date/Time: 07/19/2022 5:41 AM  Performed by: Carroll Sage, PA-C Authorized by: Carroll Sage, PA-C   Consent:    Consent obtained:  Verbal   Consent given by:  Patient   Risks, benefits, and alternatives were discussed: yes     Risks discussed:  Infection, pain,  retained foreign body, poor cosmetic result and poor wound healing   Alternatives discussed:  No treatment, delayed treatment, observation and referral Universal protocol:    Patient identity confirmed:  Verbally with patient Anesthesia:    Anesthesia method:  None Laceration details:    Location:  Scalp   Scalp location:  R temporal   Length (cm):  0.5   Depth (mm):  0.2 Pre-procedure details:    Preparation:  Patient was prepped and draped in usual sterile fashion Exploration:    Limited defect created (wound extended): no     Imaging obtained: x-ray     Imaging outcome: foreign body not noted     Wound exploration: wound explored through full range of motion and entire depth of wound visualized     Contaminated: no   Treatment:    Area cleansed with:  Saline   Amount of cleaning:  Standard   Irrigation solution:  Sterile saline   Irrigation method:  Pressure wash Skin repair:    Repair method:  Steri-Strips and tissue adhesive   Number of Steri-Strips:  2 Approximation:    Approximation:  Close Repair type:    Repair type:  Simple Post-procedure details:    Dressing:  Bulky dressing   Procedure completion:  Tolerated     Medications Ordered in ED Medications  ketorolac (TORADOL) 30 MG/ML injection 30 mg (has no administration in time range)    ED Course/ Medical Decision Making/ A&P                             Medical Decision Making Amount and/or Complexity of Data Reviewed Radiology: ordered.   This patient presents to the ED for concern of fall, this involves an extensive number of treatment options, and is a complaint that carries with it a high risk of complications and morbidity.  The differential diagnosis includes intracranial bleed, thoracic/abdominal trauma, orthopedic injury    Additional history obtained:  Additional history obtained from N/A External records from outside source obtained and reviewed including urgent care notes   Co  morbidities that complicate the patient evaluation  N/A  Social Determinants of Health:  No primary care provider    Lab Tests:  I Ordered, and personally interpreted labs.  The pertinent results include: N/A   Imaging Studies ordered:  I ordered imaging studies including CT head, maxillofacial I independently visualized and interpreted imaging which showed both negative acute findings I agree with the radiologist interpretation   Cardiac Monitoring:  The patient was maintained on a cardiac monitor.  I personally viewed and interpreted the cardiac monitored which showed an underlying rhythm of: N/A   Medicines ordered and prescription drug management:  I ordered medication including N/A I have reviewed the patients home medicines and have made adjustments as needed  Critical Interventions:  N/A   Reevaluation:  Presents after mechanical fall triage obtain imaging which I personally viewed unremarkable, patient had a small laceration beneath his right temporal lobe.  Discussed closing the wound with sutures or skin glue and with shared decision making proceed with skin glue and Steri-Strips.  Tolerated well.  Patient is agreement discharge at this time    Consultations Obtained:  N/a    Test Considered:  CT chest abdomen pelvis-defers my suspicion for thoracic/abdominal trauma is low no noted trauma on my exam both was nontender to palpation.    Rule out low suspicion for intracranial head bleed no focal deficits present on my exam ct head is negative.  Low suspicion for spinal cord abnormality or spinal fracture spine was palpated was nontender to palpation, patient has full range of motion in the upper and lower extremities ct c spine.  Low suspicion for pneumothorax as lung sounds are clear bilaterally.        Dispostion and problem list  After consideration of the diagnostic results and the patients response to treatment, I feel that the patent would  benefit from discharge.  Fall-patient is a small laceration noted on his head, wound was glued, will defer antibiotics as he is no immunocompromise noncontaminated.  Will recommend basic wound care.            Final Clinical Impression(s) / ED Diagnoses Final diagnoses:  Fall, initial encounter  Minor head injury, initial encounter    Rx / DC Orders ED Discharge Orders     None         Carroll Sage, PA-C 07/19/22 0546    Tilden Fossa, MD 07/19/22 [id number]

## 2022-07-19 NOTE — Discharge Instructions (Signed)
Imaging was reassuring, it is possible that you have a slight concussion, symptoms include a mild headache, brain fog, dizziness, lightheadedness, difficulty concentrating, increase sensitivity to light or noise.  The symptoms will resolve on their own I recommend brain rest i.e. decreasing screen time, vigorous activities and slowly reintroduce them as tolerated.  If your symptoms persist over the weeks time please follow-up with your PCP and/or the concussion clinic for further evaluation you may take over-the-counter pain medication as needed.   You have a small laceration on the side of your head which I have glued, please do not get it wet for the first 24 hours, after that you may rinse it, do not pick at it, the glue will fall off within the next 5 to 7 days.  Come back to the emergency department if you develop chest pain, shortness of breath, severe abdominal pain, uncontrolled nausea, vomiting, diarrhea.

## 2022-07-19 NOTE — ED Notes (Signed)
Assessed patient for pain and obtained vitals. Pt CAOx4 at this time with equal chest rise and fall. Pt in no obvious distress

## 2022-07-19 NOTE — ED Triage Notes (Signed)
Pt had mechanical fall onto bricks. Lac to right cheek. Denies loc or blood thinners. C/o headache and dizziness. Endorses etoh.
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
## 2023-07-01 ENCOUNTER — Emergency Department (HOSPITAL_COMMUNITY)
Admission: EM | Admit: 2023-07-01 | Discharge: 2023-07-01 | Disposition: A | Discharge status: Home / Self Care | Payer: PRIVATE HEALTH INSURANCE | Attending: Emergency Medicine | Admitting: Emergency Medicine

## 2023-07-01 ENCOUNTER — Other Ambulatory Visit: Payer: Self-pay

## 2023-07-01 ENCOUNTER — Encounter (HOSPITAL_COMMUNITY): Payer: Self-pay

## 2023-07-01 DIAGNOSIS — Z91040 Latex allergy status: Secondary | ICD-10-CM | POA: Insufficient documentation

## 2023-07-01 DIAGNOSIS — Z7982 Long term (current) use of aspirin: Secondary | ICD-10-CM | POA: Diagnosis not present

## 2023-07-01 DIAGNOSIS — Z113 Encounter for screening for infections with a predominantly sexual mode of transmission: Secondary | ICD-10-CM

## 2023-07-01 DIAGNOSIS — Z202 Contact with and (suspected) exposure to infections with a predominantly sexual mode of transmission: Secondary | ICD-10-CM | POA: Diagnosis present

## 2023-07-01 LAB — HEPATITIS PANEL, ACUTE
HCV Ab: NONREACTIVE
Hep A IgM: NONREACTIVE
Hep B C IgM: NONREACTIVE
Hepatitis B Surface Ag: NONREACTIVE

## 2023-07-01 LAB — HIV ANTIBODY (ROUTINE TESTING W REFLEX): HIV Screen 4th Generation wRfx: NONREACTIVE

## 2023-07-01 NOTE — Discharge Instructions (Signed)
 Your testing for gonorrhea, chlamydia, syphilis, HIV, and hepatitis have been sent off.  These results should come back in the next 2 to 3 days.  You will be contacted if anything is positive and you need further treatment.  For STI testing in the future, I would recommend going to a primary care provider or the health center to get tested.  Return to the ER for any other emergent concerns.

## 2023-07-01 NOTE — ED Provider Notes (Signed)
 [place] [facility] AT [facility] Provider Note   CSN: [id number] Arrival date & time: 07/01/23  1734     History  Chief Complaint  Patient presents with   SEXUALLY TRANSMITTED DISEASE    Howard Rodriguez  is a 28 y.o. male with no significant past medical history presents requesting STI testing.  States he is sexually active with one male partner and wanted to receive routine testing.  He denies any known exposures.  He denies any dysuria, penile discharge, testicular pain or swelling, abdominal pain.  Denies any lesions or rashes.  HPI     Home Medications Prior to Admission medications   Medication Sig Start Date End Date Taking? Authorizing Provider  aspirin 325 MG tablet Take 325 mg by mouth once as needed (chest pain).    [provider]  hydrOXYzine  (ATARAX ) 25 MG tablet Take 1 tablet (25 mg total) by mouth every 6 (six) hours. Patient taking differently: Take 25 mg by mouth every 6 (six) hours as needed for anxiety. 12/01/21   Coretta Dexter, PA      Allergies    Latex and Shellfish allergy    Review of Systems   Review of Systems  Constitutional:  Negative for fever.  Gastrointestinal:  Negative for abdominal pain.  Genitourinary:  Negative for penile pain and penile swelling.    Physical Exam Updated Vital Signs BP (!) 141/73 (BP Location: Right Arm)   Pulse 73   Temp 98.6 F (37 C) (Oral)   Resp 16   Ht 6\' 1"  (1.854 m)   Wt 79.4 kg   SpO2 100%   BMI 23.09 kg/m  Physical Exam Vitals and nursing note reviewed.  Constitutional:      Appearance: Normal appearance.  HENT:     Head: Atraumatic.  Pulmonary:     Effort: Pulmonary effort is normal.  Genitourinary:    Comments: Declines Neurological:     General: No focal deficit present.     Mental Status: He is alert.  Psychiatric:        Mood and Affect: Mood normal.        Behavior: Behavior normal.     ED Results / Procedures / Treatments   Labs (all labs  ordered are listed, but only abnormal results are displayed) Labs Reviewed  HIV ANTIBODY (ROUTINE TESTING W REFLEX)  RPR  HEPATITIS PANEL, ACUTE  GC/CHLAMYDIA PROBE AMP ([place]) NOT AT [facility]    EKG None  Radiology No results found.  Procedures Procedures    Medications Ordered in ED Medications - No data to display  ED Course/ Medical Decision Making/ A&P                                 Medical Decision Making Amount and/or Complexity of Data Reviewed Labs: ordered.     Differential diagnosis includes but is not limited to gonorrhea, chlamydia, syphilis, HIV, hepatitis  ED Course:  Upon initial evaluation, patient is well-appearing, stable vitals.  Wanting routine STI testing.  Denies any known exposure.  Denying any symptoms.  Declines GU exam.  Will send out urine for gonorrhea chlamydia testing.  Will send out lab testing for HIV, syphilis, hepatitis.  Medication for prophylactic treatment at this time as he denies any known exposure or symptoms.  Stable appropriate for discharge home.  Labs Ordered: I Ordered, and personally interpreted labs.  The pertinent results include:   Gonorrhea  and Chlamydia pending HIV pending RPR pending Hepatitis panel pending   Impression: Patient requesting routine STI testing  Disposition:  The patient was discharged home with instructions to review results on his MyChart portal in the next 2 to 3 days.  He was informed he will be contacted if any of his tests are positive and he needs treatment.  Discussed with patient for routine testing I would recommend going to his PCP or health center next time. Return precautions given.    This chart was dictated using voice recognition software, Dragon. Despite the best efforts of this provider to proofread and correct errors, errors may still occur which can change documentation meaning.          Final Clinical Impression(s) / ED Diagnoses Final diagnoses:  Screening  examination for STI    Rx / DC Orders ED Discharge Orders     None         Rexie Catena, PA-C 07/01/23 1933    Hershel Los, MD 07/01/23 2212

## 2023-07-02 LAB — RPR: RPR Ser Ql: NONREACTIVE
